# Patient Record
Sex: Male | Born: 1965 | Race: White | Hispanic: No | Marital: Single | Smoking: Current every day smoker
Health system: Southern US, Community
[De-identification: ages and names within clinical notes are randomized; demographics above are authoritative.]

## PROBLEM LIST (undated history)

## (undated) DIAGNOSIS — J449 Chronic obstructive pulmonary disease, unspecified: Secondary | ICD-10-CM

## (undated) DIAGNOSIS — G8929 Other chronic pain: Secondary | ICD-10-CM

## (undated) DIAGNOSIS — Z720 Tobacco use: Secondary | ICD-10-CM

## (undated) DIAGNOSIS — D649 Anemia, unspecified: Secondary | ICD-10-CM

## (undated) DIAGNOSIS — R079 Chest pain, unspecified: Secondary | ICD-10-CM

## (undated) DIAGNOSIS — IMO0001 Reserved for inherently not codable concepts without codable children: Secondary | ICD-10-CM

## (undated) DIAGNOSIS — K219 Gastro-esophageal reflux disease without esophagitis: Secondary | ICD-10-CM

## (undated) DIAGNOSIS — F1010 Alcohol abuse, uncomplicated: Secondary | ICD-10-CM

## (undated) DIAGNOSIS — N200 Calculus of kidney: Secondary | ICD-10-CM

## (undated) DIAGNOSIS — I10 Essential (primary) hypertension: Secondary | ICD-10-CM

## (undated) HISTORY — PX: OTHER SURGICAL HISTORY: SHX169

---

## 2002-04-04 ENCOUNTER — Ambulatory Visit (HOSPITAL_COMMUNITY): Admission: RE | Admit: 2002-04-04 | Discharge: 2002-04-04 | Payer: Self-pay | Admitting: *Deleted

## 2002-04-04 ENCOUNTER — Encounter (INDEPENDENT_AMBULATORY_CARE_PROVIDER_SITE_OTHER): Payer: Self-pay | Admitting: Specialist

## 2004-02-20 ENCOUNTER — Inpatient Hospital Stay (HOSPITAL_COMMUNITY): Admission: AC | Admit: 2004-02-20 | Discharge: 2004-02-23 | Payer: Self-pay

## 2004-04-09 ENCOUNTER — Encounter: Admission: RE | Admit: 2004-04-09 | Discharge: 2004-04-09 | Payer: Self-pay | Admitting: Family Medicine

## 2010-02-02 ENCOUNTER — Inpatient Hospital Stay (HOSPITAL_COMMUNITY)
Admission: AD | Admit: 2010-02-02 | Discharge: 2010-02-06 | Payer: Self-pay | Source: Home / Self Care | Admitting: Internal Medicine

## 2010-02-02 ENCOUNTER — Ambulatory Visit: Payer: Self-pay | Admitting: Diagnostic Radiology

## 2010-02-02 ENCOUNTER — Encounter: Payer: Self-pay | Admitting: Emergency Medicine

## 2010-03-30 ENCOUNTER — Encounter: Payer: Self-pay | Admitting: Family Medicine

## 2010-05-20 LAB — BASIC METABOLIC PANEL
Calcium: 8.6 mg/dL (ref 8.4–10.5)
GFR calc Af Amer: >60 mL/min (ref >60)
GFR calc non Af Amer: >60 mL/min (ref >60)
Sodium: 130 mEq/L — ABNORMAL LOW (ref 135–145)

## 2010-05-21 LAB — LIPASE, BLOOD: Lipase: 51 U/L (ref 23–300)

## 2010-05-21 LAB — TSH: TSH: 2.844 u[IU]/mL (ref 0.350–4.500)

## 2010-05-21 LAB — CULTURE, BLOOD (ROUTINE X 2)
Culture  Setup Time: 201111271932
Culture  Setup Time: 201111280942
Culture  Setup Time: 201111280942
Culture: NO GROWTH
Culture: NO GROWTH

## 2010-05-21 LAB — DIFFERENTIAL
Eosinophils Absolute: 0 10*3/uL (ref 0.0–0.7)
Eosinophils Relative: 0 % (ref 0–5)
Lymphs Abs: 0.9 10*3/uL (ref 0.7–4.0)
Monocytes Absolute: 0.7 10*3/uL (ref 0.1–1.0)

## 2010-05-21 LAB — CK TOTAL AND CKMB (NOT AT ARMC)
CK, MB: 1 ng/mL (ref 0.3–4.0)
Relative Index: INVALID (ref 0.0–2.5)
Relative Index: INVALID (ref 0.0–2.5)

## 2010-05-21 LAB — CBC
HCT: 45.5 % (ref 39.0–52.0)
HCT: 46 % (ref 39.0–52.0)
Hemoglobin: 16.1 g/dL (ref 13.0–17.0)
Hemoglobin: 17.8 g/dL — ABNORMAL HIGH (ref 13.0–17.0)
MCHC: 35 g/dL (ref 30.0–36.0)
MCV: 95 fL (ref 78.0–100.0)
MCV: 98.4 fL (ref 78.0–100.0)
Platelets: 193 10*3/uL (ref 150–400)
RBC: 4.62 MIL/uL (ref 4.22–5.81)
RBC: 5.2 MIL/uL (ref 4.22–5.81)
RDW: 12.6 % (ref 11.5–15.5)
WBC: 8.1 10*3/uL (ref 4.0–10.5)
WBC: 9.7 10*3/uL (ref 4.0–10.5)

## 2010-05-21 LAB — BASIC METABOLIC PANEL
BUN: 7 mg/dL (ref 6–23)
Chloride: 93 mEq/L — ABNORMAL LOW (ref 96–112)
Chloride: 97 mEq/L (ref 96–112)
GFR calc Af Amer: >60 mL/min (ref >60)
Glucose, Bld: 109 mg/dL — ABNORMAL HIGH (ref 70–99)
Potassium: 4.3 mEq/L (ref 3.5–5.1)
Potassium: 4.6 mEq/L (ref 3.5–5.1)
Sodium: 128 mEq/L — ABNORMAL LOW (ref 135–145)

## 2010-05-21 LAB — CULTURE, RESPIRATORY W GRAM STAIN

## 2010-05-21 LAB — LEGIONELLA ANTIGEN, URINE

## 2010-05-21 LAB — COMPREHENSIVE METABOLIC PANEL
ALT: 16 U/L (ref 0–53)
AST: 27 U/L (ref 0–37)
Albumin: 3.8 g/dL (ref 3.5–5.2)
CO2: 20 mEq/L (ref 19–32)
Calcium: 9.5 mg/dL (ref 8.4–10.5)
Chloride: 97 mEq/L (ref 96–112)
GFR calc Af Amer: >60 mL/min (ref >60)
GFR calc non Af Amer: >60 mL/min (ref >60)
Sodium: 132 mEq/L — ABNORMAL LOW (ref 135–145)

## 2010-05-21 LAB — TROPONIN I
Troponin I: 0.01 ng/mL (ref 0.00–0.06)
Troponin I: 0.01 ng/mL (ref 0.00–0.06)

## 2010-05-21 LAB — EXPECTORATED SPUTUM ASSESSMENT W GRAM STAIN, RFLX TO RESP C

## 2010-07-25 NOTE — Op Note (Signed)
   NAME:  Cody [name], Cody [name]                          ACCOUNT NO.:  [id number]   MEDICAL RECORD NO.:  [id number]                   PATIENT TYPE:  AMB   LOCATION:  ENDO                                 FACILITY:  [facility]   PHYSICIAN:  Georgiana Spinner, M.D.                 DATE OF BIRTH:  1965-05-18   DATE OF PROCEDURE:  DATE OF DISCHARGE:                                 OPERATIVE REPORT   PROCEDURE PERFORMED:  Endoscopy.   INDICATIONS FOR PROCEDURE:  Abdominal pain.   ANESTHESIA:  Demerol 60, Versed 6 mg and fentanyl 75 mcg.   DESCRIPTION OF PROCEDURE:  With the patient mildly sedated in the left  lateral decubitus position, the Olympus videoscopic endoscope was inserted  into the mouth and passed under direct vision through the esophagus which  appeared normal.  There was no evidence of Barrett's seen.  The endoscope  was advanced into the stomach, fundus body appeared normal.  The antrum  showed changes of erythema with punctate lesions which were photographed and  biopsied.  The duodenal bulb showed mild erythema.  The second portion of  the duodenum appeared normal.  From this point, the endoscope was slowly  withdrawn, taking circumferential views of the entire duodenal mucosa until  the endoscope had been pulled back into the stomach and placed in  retroflexion to view of the stomach from below.  The endoscope was then  straightened and withdrawn, taking circumferential views of the remaining  gastric and esophageal mucosa.  The patient's vital signs and pulse oximetry  remained stable.  The patient tolerated the procedure well and left the  operating room without any complications.   FINDINGS:  Erosions, ulcerations in the antrum, otherwise unremarkable  examination.  There was transient lower esophageal sphincter dysfunction.   IMPRESSION:  Antral erosions with transient lower esophageal sphincter  relaxation.   PLAN:  Await biopsy report.  The patient will call me for results and  follow  up with me as an outpatient.                                                Georgiana Spinner, M.D.    GMO/MEDQ  D:  04/04/2002  T:  04/04/2002  Job:  [id number]

## 2014-01-11 ENCOUNTER — Encounter (HOSPITAL_COMMUNITY): Payer: Self-pay | Admitting: Emergency Medicine

## 2014-01-11 ENCOUNTER — Emergency Department (HOSPITAL_COMMUNITY): Payer: BC Managed Care – PPO

## 2014-01-11 ENCOUNTER — Emergency Department (HOSPITAL_COMMUNITY)
Admission: EM | Admit: 2014-01-11 | Discharge: 2014-01-11 | Disposition: A | Discharge status: Home / Self Care | Payer: BC Managed Care – PPO | Attending: Emergency Medicine | Admitting: Emergency Medicine

## 2014-01-11 DIAGNOSIS — Z720 Tobacco use: Secondary | ICD-10-CM | POA: Insufficient documentation

## 2014-01-11 DIAGNOSIS — R079 Chest pain, unspecified: Secondary | ICD-10-CM | POA: Diagnosis present

## 2014-01-11 DIAGNOSIS — Z8739 Personal history of other diseases of the musculoskeletal system and connective tissue: Secondary | ICD-10-CM | POA: Diagnosis not present

## 2014-01-11 DIAGNOSIS — H9209 Otalgia, unspecified ear: Secondary | ICD-10-CM | POA: Insufficient documentation

## 2014-01-11 DIAGNOSIS — G8929 Other chronic pain: Secondary | ICD-10-CM | POA: Insufficient documentation

## 2014-01-11 DIAGNOSIS — J441 Chronic obstructive pulmonary disease with (acute) exacerbation: Secondary | ICD-10-CM | POA: Diagnosis not present

## 2014-01-11 DIAGNOSIS — Z87828 Personal history of other (healed) physical injury and trauma: Secondary | ICD-10-CM | POA: Insufficient documentation

## 2014-01-11 DIAGNOSIS — H539 Unspecified visual disturbance: Secondary | ICD-10-CM | POA: Insufficient documentation

## 2014-01-11 DIAGNOSIS — R0789 Other chest pain: Secondary | ICD-10-CM | POA: Diagnosis not present

## 2014-01-11 DIAGNOSIS — Z8659 Personal history of other mental and behavioral disorders: Secondary | ICD-10-CM | POA: Insufficient documentation

## 2014-01-11 HISTORY — DX: Other chronic pain: G89.29

## 2014-01-11 HISTORY — DX: Chest pain, unspecified: R07.9

## 2014-01-11 HISTORY — DX: Chronic obstructive pulmonary disease, unspecified: J44.9

## 2014-01-11 LAB — CBC WITH DIFFERENTIAL/PLATELET
BASOS ABS: 0 10*3/uL (ref 0.0–0.1)
BASOS PCT: 1 % (ref 0–1)
EOS ABS: 0.1 10*3/uL (ref 0.0–0.7)
EOS PCT: 2 % (ref 0–5)
HCT: 50.8 % (ref 39.0–52.0)
Hemoglobin: 17.7 g/dL — ABNORMAL HIGH (ref 13.0–17.0)
LYMPHS PCT: 38 % (ref 12–46)
Lymphs Abs: 2 10*3/uL (ref 0.7–4.0)
MCH: 37.3 pg — ABNORMAL HIGH (ref 26.0–34.0)
MCHC: 34.8 g/dL (ref 30.0–36.0)
MCV: 106.9 fL — AB (ref 78.0–100.0)
Monocytes Absolute: 0.5 10*3/uL (ref 0.1–1.0)
Monocytes Relative: 9 % (ref 3–12)
Neutro Abs: 2.6 10*3/uL (ref 1.7–7.7)
Neutrophils Relative %: 50 % (ref 43–77)
PLATELETS: 249 10*3/uL (ref 150–400)
RBC: 4.75 MIL/uL (ref 4.22–5.81)
RDW: 16.5 % — AB (ref 11.5–15.5)
WBC: 5.2 10*3/uL (ref 4.0–10.5)

## 2014-01-11 LAB — I-STAT TROPONIN, ED
Troponin i, poc: 0 ng/mL (ref 0.00–0.08)
Troponin i, poc: 0 ng/mL (ref 0.00–0.08)

## 2014-01-11 LAB — COMPREHENSIVE METABOLIC PANEL
ALK PHOS: 95 U/L (ref 39–117)
ALT: 18 U/L (ref 0–53)
AST: 23 U/L (ref 0–37)
Albumin: 3.2 g/dL — ABNORMAL LOW (ref 3.5–5.2)
Anion gap: 9 (ref 5–15)
BILIRUBIN TOTAL: 0.4 mg/dL (ref 0.3–1.2)
BUN: 7 mg/dL (ref 6–23)
CO2: 29 meq/L (ref 19–32)
CREATININE: 0.85 mg/dL (ref 0.50–1.35)
Calcium: 9.5 mg/dL (ref 8.4–10.5)
Chloride: 105 mEq/L (ref 96–112)
GFR calc Af Amer: >90 mL/min (ref >90)
Glucose, Bld: 115 mg/dL — ABNORMAL HIGH (ref 70–99)
Potassium: 4.2 mEq/L (ref 3.7–5.3)
Sodium: 143 mEq/L (ref 137–147)
Total Protein: 6.9 g/dL (ref 6.0–8.3)

## 2014-01-11 LAB — LIPASE, BLOOD: LIPASE: 21 U/L (ref 11–59)

## 2014-01-11 MED ORDER — ASPIRIN 81 MG PO CHEW
324.0000 mg | CHEWABLE_TABLET | Freq: Once | ORAL | Status: AC
  Administered 2014-01-11: 324 mg via ORAL
  Filled 2014-01-11: qty 4

## 2014-01-11 MED ORDER — PANTOPRAZOLE SODIUM 20 MG PO TBEC: 20.0000 mg | DELAYED_RELEASE_TABLET | Freq: Every day | ORAL | Status: DC

## 2014-01-11 MED ORDER — GI COCKTAIL ~~LOC~~
30.0000 mL | Freq: Once | ORAL | Status: AC
  Administered 2014-01-11: 30 mL via ORAL
  Filled 2014-01-11: qty 30

## 2014-01-11 NOTE — Discharge Instructions (Signed)
Chest Pain (Nonspecific) °It is often hard to give a specific diagnosis for the cause of chest pain. There is always a chance that your pain could be related to something serious, such as a heart attack or a blood clot in the lungs. You need to follow up with your health care provider for further evaluation. °CAUSES  °· Heartburn. °· Pneumonia or bronchitis. °· Anxiety or stress. °· Inflammation around your heart (pericarditis) or lung (pleuritis or pleurisy). °· A blood clot in the lung. °· A collapsed lung (pneumothorax). It can develop suddenly on its own (spontaneous pneumothorax) or from trauma to the chest. °· Shingles infection (herpes zoster virus). °The chest wall is composed of bones, muscles, and cartilage. Any of these can be the source of the pain. °· The bones can be bruised by injury. °· The muscles or cartilage can be strained by coughing or overwork. °· The cartilage can be affected by inflammation and become sore (costochondritis). °DIAGNOSIS  °Lab tests or other studies may be needed to find the cause of your pain. Your health care provider may have you take a test called an ambulatory electrocardiogram (ECG). An ECG records your heartbeat patterns over a 24-hour period. You may also have other tests, such as: °· Transthoracic echocardiogram (TTE). During echocardiography, sound waves are used to evaluate how blood flows through your heart. °· Transesophageal echocardiogram (TEE). °· Cardiac monitoring. This allows your health care provider to monitor your heart rate and rhythm in real time. °· Holter monitor. This is a portable device that records your heartbeat and can help diagnose heart arrhythmias. It allows your health care provider to track your heart activity for several days, if needed. °· Stress tests by exercise or by giving medicine that makes the heart beat faster. °TREATMENT  °· Treatment depends on what may be causing your chest pain. Treatment may include: °¨ Acid blockers for  heartburn. °¨ Anti-inflammatory medicine. °¨ Pain medicine for inflammatory conditions. °¨ Antibiotics if an infection is present. °· You may be advised to change lifestyle habits. This includes stopping smoking and avoiding alcohol, caffeine, and chocolate. °· You may be advised to keep your head raised (elevated) when sleeping. This reduces the chance of acid going backward from your stomach into your esophagus. °Most of the time, nonspecific chest pain will improve within 2-3 days with rest and mild pain medicine.  °HOME CARE INSTRUCTIONS  °· If antibiotics were prescribed, take them as directed. Finish them even if you start to feel better. °· For the next few days, avoid physical activities that bring on chest pain. Continue physical activities as directed. °· Do not use any tobacco products, including cigarettes, chewing tobacco, or electronic cigarettes. °· Avoid drinking alcohol. °· Only take medicine as directed by your health care provider. °· Follow your health care provider's suggestions for further testing if your chest pain does not go away. °· Keep any follow-up appointments you made. If you do not go to an appointment, you could develop lasting (chronic) problems with pain. If there is any problem keeping an appointment, call to reschedule. °SEEK MEDICAL CARE IF:  °· Your chest pain does not go away, even after treatment. °· You have a rash with blisters on your chest. °· You have a fever. °SEEK IMMEDIATE MEDICAL CARE IF:  °· You have increased chest pain or pain that spreads to your arm, neck, jaw, back, or abdomen. °· You have shortness of breath. °· You have an increasing cough, or you cough   up blood.  You have severe back or abdominal pain.  You feel nauseous or vomit.  You have severe weakness.  You faint.  You have chills. This is an emergency. Do not wait to see if the pain will go away. Get medical help at once. Call your local emergency services (911 in [place] Do not drive  yourself to the hospital. MAKE SURE YOU:   Understand these instructions.  Will watch your condition.  Will get help right away if you are not doing well or get worse. Document Released: 12/03/2004 Document Revised: 02/28/2013 Document Reviewed: 09/29/2007 [facility] Patient Information 2015 [place], [place]. This information is not intended to replace advice given to you by your health care provider. Make sure you discuss any questions you have with your health care provider. Alcohol Use Disorder Alcohol use disorder is a mental disorder. It is not a one-time incident of heavy drinking. Alcohol use disorder is the excessive and uncontrollable use of alcohol over time that leads to problems with functioning in one or more areas of daily living. People with this disorder risk harming themselves and others when they drink to excess. Alcohol use disorder also can cause other mental disorders, such as mood and anxiety disorders, and serious physical problems. People with alcohol use disorder often misuse other drugs.  Alcohol use disorder is common and widespread. Some people with this disorder drink alcohol to cope with or escape from negative life events. Others drink to relieve chronic pain or symptoms of mental illness. People with a family history of alcohol use disorder are at higher risk of losing control and using alcohol to excess.  SYMPTOMS  Signs and symptoms of alcohol use disorder may include the following:   Consumption ofalcohol inlarger amounts or over a longer period of time than intended.  Multiple unsuccessful attempts to cutdown or control alcohol use.   A great deal of time spent obtaining alcohol, using alcohol, or recovering from the effects of alcohol (hangover).  A strong desire or urge to use alcohol (cravings).   Continued use of alcohol despite problems at work, school, or home because of alcohol use.   Continued use of alcohol despite problems in relationships  because of alcohol use.  Continued use of alcohol in situations when it is physically hazardous, such as driving a car.  Continued use of alcohol despite awareness of a physical or psychological problem that is likely related to alcohol use. Physical problems related to alcohol use can involve the brain, heart, liver, stomach, and intestines. Psychological problems related to alcohol use include intoxication, depression, anxiety, psychosis, delirium, and dementia.   The need for increased amounts of alcohol to achieve the same desired effect, or a decreased effect from the consumption of the same amount of alcohol (tolerance).  Withdrawal symptoms upon reducing or stopping alcohol use, or alcohol use to reduce or avoid withdrawal symptoms. Withdrawal symptoms include:  Racing heart.  Hand tremor.  Difficulty sleeping.  Nausea.  Vomiting.  Hallucinations.  Restlessness.  Seizures. DIAGNOSIS Alcohol use disorder is diagnosed through an assessment by your health care provider. Your health care provider may start by asking three or four questions to screen for excessive or problematic alcohol use. To confirm a diagnosis of alcohol use disorder, at least two symptoms must be present within a 45-month period. The severity of alcohol use disorder depends on the number of symptoms:  Mild--two or three.  Moderate--four or five.  Severe--six or more. Your health care provider may perform a  physical exam or use results from lab tests to see if you have physical problems resulting from alcohol use. Your health care provider may refer you to a mental health professional for evaluation. TREATMENT  Some people with alcohol use disorder are able to reduce their alcohol use to low-risk levels. Some people with alcohol use disorder need to quit drinking alcohol. When necessary, mental health professionals with specialized training in substance use treatment can help. Your health care provider can  help you decide how severe your alcohol use disorder is and what type of treatment you need. The following forms of treatment are available:   Detoxification. Detoxification involves the use of prescription medicines to prevent alcohol withdrawal symptoms in the first week after quitting. This is important for people with a history of symptoms of withdrawal and for heavy drinkers who are likely to have withdrawal symptoms. Alcohol withdrawal can be dangerous and, in severe cases, cause death. Detoxification is usually provided in a hospital or in-patient substance use treatment facility.  Counseling or talk therapy. Talk therapy is provided by substance use treatment counselors. It addresses the reasons people use alcohol and ways to keep them from drinking again. The goals of talk therapy are to help people with alcohol use disorder find healthy activities and ways to cope with life stress, to identify and avoid triggers for alcohol use, and to handle cravings, which can cause relapse.  Medicines.Different medicines can help treat alcohol use disorder through the following actions:  Decrease alcohol cravings.  Decrease the positive reward response felt from alcohol use.  Produce an uncomfortable physical reaction when alcohol is used (aversion therapy).  Support groups. Support groups are run by people who have quit drinking. They provide emotional support, advice, and guidance. These forms of treatment are often combined. Some people with alcohol use disorder benefit from intensive combination treatment provided by specialized substance use treatment centers. Both inpatient and outpatient treatment programs are available. Document Released: 04/02/2004 Document Revised: 07/10/2013 Document Reviewed: 06/02/2012 [facility] Patient Information 2015 [place], [place]. This information is not intended to replace advice given to you by your health care provider. Make sure you discuss any questions you have  with your health care provider.
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
## 2014-01-11 NOTE — ED Notes (Signed)
Patient c/o mid-sternal chest pain x2 weeks that has progressively gotten worse in past 2 days. Patient reports shortness of breath but denies any weakness, dizziness, or back pain. Reports taking Advil yesterday with no relief.

## 2014-08-16 ENCOUNTER — Observation Stay (HOSPITAL_COMMUNITY)
Admission: EM | Admit: 2014-08-16 | Discharge: 2014-08-17 | Disposition: A | Discharge status: Home / Self Care | Payer: 59 | Attending: Family Medicine | Admitting: Family Medicine

## 2014-08-16 ENCOUNTER — Emergency Department (HOSPITAL_COMMUNITY): Payer: 59

## 2014-08-16 ENCOUNTER — Encounter (HOSPITAL_COMMUNITY): Payer: Self-pay

## 2014-08-16 DIAGNOSIS — J449 Chronic obstructive pulmonary disease, unspecified: Secondary | ICD-10-CM | POA: Diagnosis not present

## 2014-08-16 DIAGNOSIS — M1990 Unspecified osteoarthritis, unspecified site: Secondary | ICD-10-CM | POA: Insufficient documentation

## 2014-08-16 DIAGNOSIS — F1010 Alcohol abuse, uncomplicated: Secondary | ICD-10-CM

## 2014-08-16 DIAGNOSIS — I10 Essential (primary) hypertension: Secondary | ICD-10-CM

## 2014-08-16 DIAGNOSIS — F329 Major depressive disorder, single episode, unspecified: Secondary | ICD-10-CM | POA: Insufficient documentation

## 2014-08-16 DIAGNOSIS — R1013 Epigastric pain: Secondary | ICD-10-CM | POA: Insufficient documentation

## 2014-08-16 DIAGNOSIS — G8929 Other chronic pain: Secondary | ICD-10-CM | POA: Insufficient documentation

## 2014-08-16 DIAGNOSIS — K921 Melena: Secondary | ICD-10-CM | POA: Insufficient documentation

## 2014-08-16 DIAGNOSIS — R079 Chest pain, unspecified: Secondary | ICD-10-CM | POA: Diagnosis present

## 2014-08-16 DIAGNOSIS — Z720 Tobacco use: Secondary | ICD-10-CM | POA: Diagnosis not present

## 2014-08-16 DIAGNOSIS — R55 Syncope and collapse: Secondary | ICD-10-CM | POA: Diagnosis not present

## 2014-08-16 HISTORY — DX: Tobacco use: Z72.0

## 2014-08-16 HISTORY — DX: Essential (primary) hypertension: I10

## 2014-08-16 HISTORY — DX: Alcohol abuse, uncomplicated: F10.10

## 2014-08-16 LAB — URINALYSIS, ROUTINE W REFLEX MICROSCOPIC
GLUCOSE, UA: 100 mg/dL — AB
Hgb urine dipstick: NEGATIVE
KETONES UR: NEGATIVE mg/dL
Leukocytes, UA: NEGATIVE
NITRITE: NEGATIVE
PH: 7 (ref 5.0–8.0)
Protein, ur: NEGATIVE mg/dL
Specific Gravity, Urine: 1.01 (ref 1.005–1.030)
Urobilinogen, UA: 2 mg/dL — ABNORMAL HIGH (ref 0.0–1.0)

## 2014-08-16 LAB — CBC
HCT: 43.3 % (ref 39.0–52.0)
HEMOGLOBIN: 16.4 g/dL (ref 13.0–17.0)
MCH: 43 pg — ABNORMAL HIGH (ref 26.0–34.0)
MCHC: 37.9 g/dL — ABNORMAL HIGH (ref 30.0–36.0)
MCV: 113.6 fL — AB (ref 78.0–100.0)
Platelets: 304 10*3/uL (ref 150–400)
RBC: 3.81 MIL/uL — ABNORMAL LOW (ref 4.22–5.81)
RDW: 12.9 % (ref 11.5–15.5)
WBC: 6.3 10*3/uL (ref 4.0–10.5)

## 2014-08-16 LAB — HEPATIC FUNCTION PANEL
ALBUMIN: 3.4 g/dL — AB (ref 3.5–5.0)
ALT: 100 U/L — AB (ref 17–63)
AST: 93 U/L — ABNORMAL HIGH (ref 15–41)
Alkaline Phosphatase: 56 U/L (ref 38–126)
BILIRUBIN INDIRECT: 0.9 mg/dL (ref 0.3–0.9)
BILIRUBIN TOTAL: 1.2 mg/dL (ref 0.3–1.2)
Bilirubin, Direct: 0.3 mg/dL (ref 0.1–0.5)
Total Protein: 6.9 g/dL (ref 6.5–8.1)

## 2014-08-16 LAB — BASIC METABOLIC PANEL
ANION GAP: 11 (ref 5–15)
BUN: 18 mg/dL (ref 6–20)
CALCIUM: 9.4 mg/dL (ref 8.9–10.3)
CO2: 29 mmol/L (ref 22–32)
Chloride: 95 mmol/L — ABNORMAL LOW (ref 101–111)
Creatinine, Ser: 1.13 mg/dL (ref 0.61–1.24)
GFR calc Af Amer: >60 mL/min (ref >60)
GFR calc non Af Amer: >60 mL/min (ref >60)
Glucose, Bld: 147 mg/dL — ABNORMAL HIGH (ref 65–99)
Potassium: 3.4 mmol/L — ABNORMAL LOW (ref 3.5–5.1)
SODIUM: 135 mmol/L (ref 135–145)

## 2014-08-16 LAB — VITAMIN B12: Vitamin B-12: 443 pg/mL (ref 180–914)

## 2014-08-16 LAB — TROPONIN I
Troponin I: <0.03 ng/mL (ref <0.031)
Troponin I: <0.03 ng/mL (ref <0.031)

## 2014-08-16 LAB — LIPASE, BLOOD: Lipase: 53 U/L — ABNORMAL HIGH (ref 22–51)

## 2014-08-16 LAB — TSH: TSH: 2.144 u[IU]/mL (ref 0.350–4.500)

## 2014-08-16 LAB — POC OCCULT BLOOD, ED: Fecal Occult Bld: NEGATIVE

## 2014-08-16 MED ORDER — GI COCKTAIL ~~LOC~~
30.0000 mL | Freq: Once | ORAL | Status: AC
  Administered 2014-08-16: 30 mL via ORAL
  Filled 2014-08-16: qty 30

## 2014-08-16 MED ORDER — ONDANSETRON HCL 4 MG PO TABS: 4.0000 mg | ORAL_TABLET | Freq: Four times a day (QID) | ORAL | Status: DC | PRN

## 2014-08-16 MED ORDER — PANTOPRAZOLE SODIUM 40 MG IV SOLR
40.0000 mg | Freq: Two times a day (BID) | INTRAVENOUS | Status: DC
  Administered 2014-08-16: 40 mg via INTRAVENOUS
  Filled 2014-08-16: qty 40

## 2014-08-16 MED ORDER — PANTOPRAZOLE SODIUM 40 MG IV SOLR: 40.0000 mg | Freq: Once | INTRAVENOUS | Status: DC

## 2014-08-16 MED ORDER — PANTOPRAZOLE SODIUM 40 MG IV SOLR
40.0000 mg | Freq: Once | INTRAVENOUS | Status: AC
  Administered 2014-08-16: 40 mg via INTRAVENOUS
  Filled 2014-08-16: qty 40

## 2014-08-16 MED ORDER — SODIUM CHLORIDE 0.9 % IV SOLN
INTRAVENOUS | Status: DC
  Administered 2014-08-16: 18:00:00 via INTRAVENOUS

## 2014-08-16 MED ORDER — SODIUM CHLORIDE 0.9 % IV BOLUS (SEPSIS)
1000.0000 mL | Freq: Once | INTRAVENOUS | Status: AC
  Administered 2014-08-16: 1000 mL via INTRAVENOUS

## 2014-08-16 MED ORDER — ASPIRIN 325 MG PO TABS
325.0000 mg | ORAL_TABLET | ORAL | Status: AC
  Administered 2014-08-16: 325 mg via ORAL
  Filled 2014-08-16: qty 1

## 2014-08-16 MED ORDER — AMLODIPINE BESYLATE 5 MG PO TABS
5.0000 mg | ORAL_TABLET | Freq: Every day | ORAL | Status: DC
  Administered 2014-08-16 – 2014-08-17 (×2): 5 mg via ORAL
  Filled 2014-08-16 (×2): qty 1

## 2014-08-16 MED ORDER — ONDANSETRON HCL 4 MG/2ML IJ SOLN: 4.0000 mg | Freq: Four times a day (QID) | INTRAMUSCULAR | Status: DC | PRN

## 2014-08-16 MED ORDER — PANTOPRAZOLE SODIUM 40 MG IV SOLR
40.0000 mg | Freq: Two times a day (BID) | INTRAVENOUS | Status: DC
Start: 2014-08-17 — End: 2014-08-17
  Administered 2014-08-17: 40 mg via INTRAVENOUS
  Filled 2014-08-16: qty 40

## 2014-08-16 MED ORDER — NICOTINE 21 MG/24HR TD PT24
21.0000 mg | MEDICATED_PATCH | Freq: Every day | TRANSDERMAL | Status: DC
  Administered 2014-08-16 – 2014-08-17 (×2): 21 mg via TRANSDERMAL
  Filled 2014-08-16 (×2): qty 1

## 2014-08-16 MED ORDER — FENTANYL CITRATE (PF) 100 MCG/2ML IJ SOLN
50.0000 ug | Freq: Once | INTRAMUSCULAR | Status: AC
  Administered 2014-08-16: 50 ug via INTRAVENOUS
  Filled 2014-08-16: qty 2

## 2014-08-16 MED ORDER — METOCLOPRAMIDE HCL 5 MG/ML IJ SOLN
10.0000 mg | Freq: Once | INTRAMUSCULAR | Status: AC
  Administered 2014-08-16: 10 mg via INTRAVENOUS
  Filled 2014-08-16: qty 2

## 2014-08-16 MED ORDER — TRAMADOL HCL 50 MG PO TABS
50.0000 mg | ORAL_TABLET | Freq: Once | ORAL | Status: AC
  Administered 2014-08-16: 50 mg via ORAL
  Filled 2014-08-16: qty 1

## 2014-08-16 MED ORDER — SODIUM CHLORIDE 0.9 % IJ SOLN: 3.0000 mL | Freq: Two times a day (BID) | INTRAMUSCULAR | Status: DC

## 2014-08-16 MED ORDER — ONDANSETRON HCL 4 MG/2ML IJ SOLN: 4.0000 mg | Freq: Once | INTRAMUSCULAR | Status: DC

## 2014-08-16 NOTE — ED Notes (Signed)
Pt also co difficulty swallowing x 1 week.

## 2014-08-16 NOTE — ED Notes (Signed)
Patient reports recurrent chest pain . Getting worse over the last week. Burning in the center of chest. BP fluctuating and dizziness

## 2014-08-16 NOTE — H&P (Signed)
[facility] History and Physical  ERSKINE STEINFELDT IEP:[id number] DOB: 1965-07-09 DOA: 08/16/2014  Referring physician: ER, Dr. Lacinda Axon PCP: Edmonia James, PA-C   Chief Complaint: Syncope. Chest pain.  HPI: Cody [name] is a 49 y.o. male  This is a 49 year old man who presented with 2 episodes of syncope in the last week. He had one yesterday. He describes a violent cough and after this he lost consciousness. He also describes chest pain which is burning in nature which is been present for several days starting from the epigastrium radiating aching up to his upper chest. It does not radiate into the arms. He admits that he drinks too much alcohol but he is trying to cut down. He also smokes cigarettes. He denies any nausea, dyspnea or fever. Evaluation in the emergency room does not show a cause of his syncope or chest pain but he is now being admitted for further investigations.   Review of Systems:  Apart from symptoms above, all systems negative.  Past Medical History  Diagnosis Date  . Arthritis   . COPD (chronic obstructive pulmonary disease)   . Depression   . Chronic chest pain   . Hypertension   . Alcohol abuse 08/16/2014  . Tobacco abuse 08/16/2014   History reviewed. No pertinent past surgical history. Social History:  reports that he has been smoking Cigarettes.  He has a 70 pack-year smoking history. He has never used smokeless tobacco. He reports that he drinks alcohol. He reports that he does not use illicit drugs.  No Known Allergies  Family History  Problem Relation Age of Onset  . Cancer Other   . Diabetes Other       Prior to Admission medications   Medication Sig Start Date End Date Taking? Authorizing Provider  amLODipine (NORVASC) 10 MG tablet Take 10 mg by mouth. 05/23/14 05/23/15 Yes Historical Provider, MD  ibuprofen (ADVIL,MOTRIN) 200 MG tablet Take 400 mg by mouth every 6 (six) hours as needed for moderate pain.   Yes Historical Provider, MD    lisinopril-hydrochlorothiazide (PRINZIDE,ZESTORETIC) 10-12.5 MG per tablet Take 1 tablet by mouth. 01/12/14 01/12/15 Yes Historical Provider, MD  pantoprazole (PROTONIX) 20 MG tablet Take 1 tablet (20 mg total) by mouth daily. Patient not taking: Reported on 08/16/2014 01/11/14   Pattricia Boss, MD   Physical Exam: Filed Vitals:   08/16/14 1515 08/16/14 1530 08/16/14 1545 08/16/14 1600  BP:  105/53  125/92  Pulse: 77 85 89 72  Temp:      TempSrc:      Resp: 17 15 17 15   Height:      Weight:      SpO2: 96% 96% 97% 97%    Wt Readings from Last 3 Encounters:  08/16/14 104.327 kg (230 lb)  01/11/14 104.327 kg (230 lb)    General:  Appears calm and comfortable. He does not appear to be in pain at the present time. Obese. Eyes: PERRL, normal lids, irises & conjunctiva ENT: grossly normal hearing, lips & tongue Neck: no LAD, masses or thyromegaly Cardiovascular: RRR, no m/r/g. No LE edema. Telemetry: SR, no arrhythmias  Respiratory: Bilateral scattered wheezing but no respiratory distress per there are no crackles or bronchial breathing. Abdomen: soft, ntnd Skin: no rash or induration seen on limited exam Musculoskeletal: grossly normal tone BUE/BLE Psychiatric: grossly normal mood and affect, speech fluent and appropriate Neurologic: grossly non-focal.          Labs on Admission:  Basic Metabolic Panel:  Recent Labs  Lab 08/16/14 1213  NA 135  K 3.4*  CL 95*  CO2 29  GLUCOSE 147*  BUN 18  CREATININE 1.13  CALCIUM 9.4   Liver Function Tests:  Recent Labs Lab 08/16/14 1213  AST 93*  ALT 100*  ALKPHOS 56  BILITOT 1.2  PROT 6.9  ALBUMIN 3.4*    Recent Labs Lab 08/16/14 1213  LIPASE 53*   No results for input(s): AMMONIA in the last 168 hours. CBC:  Recent Labs Lab 08/16/14 1213  WBC 6.3  HGB 16.4  HCT 43.3  MCV 113.6*  PLT 304   Cardiac Enzymes:  Recent Labs Lab 08/16/14 1213  TROPONINI <0.03    BNP (last 3 results) No results for input(s):  BNP in the last 8760 hours.  ProBNP (last 3 results) No results for input(s): PROBNP in the last 8760 hours.  CBG: No results for input(s): GLUCAP in the last 168 hours.  Radiological Exams on Admission: Dg Chest Port 1 View  08/16/2014   CLINICAL DATA:  Centralized chest pain for 1 week. Difficulty swallowing.  EXAM: PORTABLE CHEST - 1 VIEW  COMPARISON:  01/11/2014  FINDINGS: Stable cardiomegaly without edema, definite pneumonia, collapse or consolidation. No effusion pneumothorax. Azygos fissure in the right upper lobe noted, normal variant. No acute osseous finding. Healed upper left rib fractures.  IMPRESSION: Cardiomegaly without acute process.   Electronically Signed   By: Jerilynn Mages.  Shick M.D.   On: 08/16/2014 12:48    EKG: Independently reviewed. Sinus rhythm without any acute ST-T wave changes.  Assessment/Plan   1. Syncope. Etiologies not clear. One possibility is a vasovagal event from his description. We will cycle cardiac enzymes, obtain echocardiogram. 2. Chest pain. I think this likely represents severe heartburn and he could have esophagitis. I will ask gastroenterology to see this patient in consultation with a view to upper GI endoscopy, especially in view of his heavy alcohol consumption. Start intravenous Protonix twice a day. 3. Hypertension. Stable. Blood pressure actually has been on the soft side and we will discontinue one of his blood pressure medications and reduce amlodipine to 5 mg daily. 4. Alcohol abuse. We discussed how to reduce and eventually stop this. 5. Tobacco abuse. We also discussed smoking cessation. 6. Macrocytosis. Check B-12 and folate levels although I think it is likely secondary to alcohol itself. 7. Elevated liver enzymes. This is likely from alcohol abuse.  Further recommendations will depend on patient's hospital progress.  Code Status: Full code.  DVT Prophylaxis: SCDs.  Family Communication: I discussed the plan with the patient at the  bedside.   Disposition Plan: Home when medically stable.  Time spent: 60 minutes.  Doree Albee [facility] Pager [phone].

## 2014-08-16 NOTE — ED Provider Notes (Signed)
CSN: [id number]     Arrival date & time 08/16/14  1200 History   First MD Initiated Contact with Patient 08/16/14 1225     Chief Complaint  Patient presents with  . Chest Pain     (Consider location/radiation/quality/duration/timing/severity/associated sxs/prior Treatment) HPI.... Syncopal episodes 2 in the past week. Blood pressure has been noted to be low. Patient stopped his blood pressure medications recently. He has a history of alcohol and tobacco abuse.  Additionally, patient complains of a burning sensation in his epigastrium radiating to his upper chest. Negative stress tests approximate 5 years ago. No dyspnea, diaphoresis, nausea.  Past Medical History  Diagnosis Date  . Arthritis   . COPD (chronic obstructive pulmonary disease)   . Depression   . Chronic chest pain   . Hypertension    History reviewed. No pertinent past surgical history. Family History  Problem Relation Age of Onset  . Cancer Other   . Diabetes Other    History  Substance Use Topics  . Smoking status: Current Every Day Smoker -- 2.00 packs/day for 35 years    Types: Cigarettes  . Smokeless tobacco: Never Used  . Alcohol Use: Yes     Comment: 1 pint of liquor a night    Review of Systems  All other systems reviewed and are negative.     Allergies  Review of patient's allergies indicates no known allergies.  Home Medications   Prior to Admission medications   Medication Sig Start Date End Date Taking? Authorizing Provider  amLODipine (NORVASC) 10 MG tablet Take 10 mg by mouth. 05/23/14 05/23/15 Yes Historical Provider, MD  ibuprofen (ADVIL,MOTRIN) 200 MG tablet Take 400 mg by mouth every 6 (six) hours as needed for moderate pain.   Yes Historical Provider, MD  lisinopril-hydrochlorothiazide (PRINZIDE,ZESTORETIC) 10-12.5 MG per tablet Take 1 tablet by mouth. 01/12/14 01/12/15 Yes Historical Provider, MD  pantoprazole (PROTONIX) 20 MG tablet Take 1 tablet (20 mg total) by mouth daily. Patient  not taking: Reported on 08/16/2014 01/11/14   Pattricia Boss, MD   BP 125/92 mmHg  Pulse 72  Temp(Src) 98.6 F (37 C) (Oral)  Resp 15  Ht 5\' 10"  (1.778 m)  Wt 230 lb (104.327 kg)  BMI 33.00 kg/m2  SpO2 97% Physical Exam  Constitutional: He is oriented to person, place, and time. He appears well-developed and well-nourished.  HENT:  Head: Normocephalic and atraumatic.  Eyes: Conjunctivae and EOM are normal. Pupils are equal, round, and reactive to light.  Neck: Normal range of motion. Neck supple.  Cardiovascular: Normal rate and regular rhythm.   Pulmonary/Chest: Effort normal and breath sounds normal.  Abdominal: Soft. Bowel sounds are normal.  Musculoskeletal: Normal range of motion.  Neurological: He is alert and oriented to person, place, and time.  Skin: Skin is warm and dry.  Psychiatric: He has a normal mood and affect. His behavior is normal.  Nursing note and vitals reviewed.   ED Course  Procedures (including critical care time) Labs Review Labs Reviewed  CBC - Abnormal; Notable for the following:    RBC 3.81 (*)    MCV 113.6 (*)    MCH 43.0 (*)    MCHC 37.9 (*)    All other components within normal limits  BASIC METABOLIC PANEL - Abnormal; Notable for the following:    Potassium 3.4 (*)    Chloride 95 (*)    Glucose, Bld 147 (*)    All other components within normal limits  TROPONIN I  HEPATIC FUNCTION  PANEL  LIPASE, BLOOD  URINALYSIS, ROUTINE W REFLEX MICROSCOPIC (NOT AT [facility])  POC OCCULT BLOOD, ED    Imaging Review Dg Chest Port 1 View  08/16/2014   CLINICAL DATA:  Centralized chest pain for 1 week. Difficulty swallowing.  EXAM: PORTABLE CHEST - 1 VIEW  COMPARISON:  01/11/2014  FINDINGS: Stable cardiomegaly without edema, definite pneumonia, collapse or consolidation. No effusion pneumothorax. Azygos fissure in the right upper lobe noted, normal variant. No acute osseous finding. Healed upper left rib fractures.  IMPRESSION: Cardiomegaly without acute  process.   Electronically Signed   By: Jerilynn Mages.  Shick M.D.   On: 08/16/2014 12:48     EKG Interpretation   Date/Time:  Thursday August 16 2014 15:55:30 EDT Ventricular Rate:  84 PR Interval:  171 QRS Duration: 87 QT Interval:  362 QTC Calculation: 428 R Axis:   68 Text Interpretation:  Sinus rhythm Multiple ventricular premature  complexes Low voltage, extremity and precordial leads left posterior  fascicular block resolved No significant change since last tracing  Confirmed by KNAPP  MD-J, JON ([id number]) on 08/16/2014 3:58:03 PM      MDM   Final diagnoses:  Syncope, unspecified syncope type    Patient complains of syncope and burning in his lower chest.  EKG shows no acute changes. Troponin negative. Chest x-ray negative. Admit to workup syncope, hypotension, alcohol abuse.   Nat Christen, MD 08/16/14 [id number]

## 2014-08-16 NOTE — ED Notes (Signed)
Pt co return of epigastric pain.

## 2014-08-17 ENCOUNTER — Telehealth: Payer: Self-pay | Admitting: Nurse Practitioner

## 2014-08-17 ENCOUNTER — Inpatient Hospital Stay (HOSPITAL_COMMUNITY): Payer: 59

## 2014-08-17 DIAGNOSIS — F1010 Alcohol abuse, uncomplicated: Secondary | ICD-10-CM | POA: Diagnosis not present

## 2014-08-17 DIAGNOSIS — K921 Melena: Secondary | ICD-10-CM

## 2014-08-17 DIAGNOSIS — R1013 Epigastric pain: Secondary | ICD-10-CM | POA: Diagnosis not present

## 2014-08-17 DIAGNOSIS — R079 Chest pain, unspecified: Secondary | ICD-10-CM

## 2014-08-17 DIAGNOSIS — Z720 Tobacco use: Secondary | ICD-10-CM | POA: Diagnosis not present

## 2014-08-17 LAB — COMPREHENSIVE METABOLIC PANEL
ALBUMIN: 2.7 g/dL — AB (ref 3.5–5.0)
ALT: 69 U/L — ABNORMAL HIGH (ref 17–63)
AST: 49 U/L — AB (ref 15–41)
Alkaline Phosphatase: 45 U/L (ref 38–126)
Anion gap: 4 — ABNORMAL LOW (ref 5–15)
BUN: 13 mg/dL (ref 6–20)
CALCIUM: 8 mg/dL — AB (ref 8.9–10.3)
CHLORIDE: 106 mmol/L (ref 101–111)
CO2: 26 mmol/L (ref 22–32)
Creatinine, Ser: 0.72 mg/dL (ref 0.61–1.24)
GFR calc Af Amer: >60 mL/min (ref >60)
GFR calc non Af Amer: >60 mL/min (ref >60)
Glucose, Bld: 114 mg/dL — ABNORMAL HIGH (ref 65–99)
Potassium: 3.2 mmol/L — ABNORMAL LOW (ref 3.5–5.1)
Sodium: 136 mmol/L (ref 135–145)
Total Bilirubin: 0.7 mg/dL (ref 0.3–1.2)
Total Protein: 5.4 g/dL — ABNORMAL LOW (ref 6.5–8.1)

## 2014-08-17 LAB — CBC
HEMATOCRIT: 40.4 % (ref 39.0–52.0)
Hemoglobin: 13.8 g/dL (ref 13.0–17.0)
MCH: 40.5 pg — ABNORMAL HIGH (ref 26.0–34.0)
MCHC: 34.2 g/dL (ref 30.0–36.0)
MCV: 118.5 fL — AB (ref 78.0–100.0)
Platelets: 282 10*3/uL (ref 150–400)
RBC: 3.41 MIL/uL — ABNORMAL LOW (ref 4.22–5.81)
RDW: 13.7 % (ref 11.5–15.5)
WBC: 6.3 10*3/uL (ref 4.0–10.5)

## 2014-08-17 LAB — HEMOGLOBIN A1C
HEMOGLOBIN A1C: 6 % — AB (ref 4.8–5.6)
MEAN PLASMA GLUCOSE: 126 mg/dL

## 2014-08-17 LAB — TROPONIN I

## 2014-08-17 LAB — PROTIME-INR
INR: 0.96 (ref 0.00–1.49)
PROTHROMBIN TIME: 13 s (ref 11.6–15.2)

## 2014-08-17 MED ORDER — NICOTINE 21 MG/24HR TD PT24: 21.0000 mg | MEDICATED_PATCH | Freq: Every day | TRANSDERMAL | Status: DC

## 2014-08-17 MED ORDER — PERFLUTREN LIPID MICROSPHERE
1.0000 mL | INTRAVENOUS | Status: AC | PRN
  Administered 2014-08-17: 2 mL via INTRAVENOUS
  Filled 2014-08-17: qty 10

## 2014-08-17 MED ORDER — PANTOPRAZOLE SODIUM 40 MG PO TBEC: 80.0000 mg | DELAYED_RELEASE_TABLET | Freq: Every day | ORAL | Status: DC

## 2014-08-17 MED ORDER — POTASSIUM CHLORIDE CRYS ER 20 MEQ PO TBCR
40.0000 meq | EXTENDED_RELEASE_TABLET | Freq: Once | ORAL | Status: AC
  Administered 2014-08-17: 40 meq via ORAL
  Filled 2014-08-17: qty 2

## 2014-08-17 MED ORDER — PANTOPRAZOLE SODIUM 40 MG PO TBEC: 40.0000 mg | DELAYED_RELEASE_TABLET | Freq: Two times a day (BID) | ORAL | Status: DC

## 2014-08-17 MED ORDER — SUCRALFATE 1 G PO TABS
1.0000 g | ORAL_TABLET | Freq: Three times a day (TID) | ORAL | Status: DC | PRN
  Administered 2014-08-17 (×2): 1 g via ORAL
  Filled 2014-08-17 (×2): qty 1

## 2014-08-17 NOTE — Care Management Note (Signed)
Case Management Note  Patient Details  Name: Cody [name] MRN: [id number] Date of Birth: 1965/07/05  Expected Discharge Date:                  Expected Discharge Plan:  Home/Self Care  In-House Referral:  NA  Discharge planning Services  CM Consult  Post Acute Care Choice:  NA Choice offered to:  NA  DME Arranged:    DME Agency:     HH Arranged:    [place] Agency:     Status of Service:  Completed, signed off  Medicare Important Message Given:    Date Medicare IM Given:    Medicare IM give by:    Date Additional Medicare IM Given:    Additional Medicare Important Message give by:     If discussed at [place] of Stay Meetings, dates discussed:    Additional Comments: Pt is from home, lives alone and is independent at baseline. Pt has no HH services or DME's prior to admission. Pt plans to discharge home today with self care.  Sherald Barge, RN 08/17/2014, 12:08 PM

## 2014-08-17 NOTE — Consult Note (Signed)
Referring Provider: No ref. provider found Primary Care Physician:  Edmonia James, PA-C Primary Gastroenterologist:  None in system  Date of Admission: 08/16/14 Date of Consultation: 08/17/14  Reason for Consultation:  Chest pain with possible gastritis/esophagitis etiology  HPI:  49 year old Cody [name] presented to the ER with chest pain and syncope. His ER workup did not find an etiology for his chest pain or syncope so he was admitted for further evaluation and management. He states he has a history of drinking a fifth of liquor a day and a gallon or more on the weekends. It was determined his chest pain is likely due to upper GI issue and GI was subsequently consulted. The patient states he was "3/4 drunk" on memorial day weekend, ate some sausage and had sudden and unexpected vomiting. Since then he has had persistent esophageal burning, had 2-3 days of subsequent black tarry stools, and also with persistent painful swallowing. Denies other abdominal pain, hematochezia, hematemesis, jaundice, darkened urine. At that point he made the decision to try and wean off the alcohol. He is currently down to half a pint a day. States "I know these issues are cause of how much a drink and I've done it to myself, but I'm willing to do what I need to in order to try and right things and get better." Denies any other upper or lower GI symptoms.   Past Medical History  Diagnosis Date  . Arthritis   . COPD (chronic obstructive pulmonary disease)   . Depression   . Chronic chest pain   . Hypertension   . Alcohol abuse 08/16/2014  . Tobacco abuse 08/16/2014    History reviewed. No pertinent past surgical history.  Prior to Admission medications   Medication Sig Start Date End Date Taking? Authorizing Provider  amLODipine (NORVASC) 10 MG tablet Take 10 mg by mouth. 05/23/14 05/23/15 Yes Historical Provider, MD  ibuprofen (ADVIL,MOTRIN) 200 MG tablet Take 400 mg by mouth every 6 (six) hours as needed for  moderate pain.   Yes Historical Provider, MD  lisinopril-hydrochlorothiazide (PRINZIDE,ZESTORETIC) 10-12.5 MG per tablet Take 1 tablet by mouth. 01/12/14 01/12/15 Yes Historical Provider, MD  pantoprazole (PROTONIX) 20 MG tablet Take 1 tablet (20 mg total) by mouth daily. Patient not taking: Reported on 08/16/2014 01/11/14   Pattricia Boss, MD    Current Facility-Administered Medications  Medication Dose Route Frequency Provider Last Rate Last Dose  . 0.9 %  sodium chloride infusion   Intravenous Continuous Radene Gunning, NP 10 mL/hr at 08/17/14 0848    . amLODipine (NORVASC) tablet 5 mg  5 mg Oral Daily Nimish Luther Parody, MD   5 mg at 08/17/14 0910  . nicotine (NICODERM CQ - dosed in mg/24 hours) patch 21 mg  21 mg Transdermal Daily Nimish C Anastasio Champion, MD   21 mg at 08/17/14 0912  . ondansetron (ZOFRAN) tablet 4 mg  4 mg Oral Q6H PRN Nimish Luther Parody, MD       Or  . ondansetron (ZOFRAN) injection 4 mg  4 mg Intravenous Q6H PRN Nimish C Gosrani, MD      . pantoprazole (PROTONIX) injection 40 mg  40 mg Intravenous BID AC Danie Binder, MD   40 mg at 08/17/14 0908  . sodium chloride 0.9 % injection 3 mL  3 mL Intravenous Q12H Nimish C Gosrani, MD   3 mL at 08/16/14 2200  . sucralfate (CARAFATE) tablet 1 g  1 g Oral TID PRN Deneise Lever, MD  1 g at 08/17/14 0934    Allergies as of 08/16/2014  . (No Known Allergies)    Family History  Problem Relation Age of Onset  . Cancer Other   . Diabetes Other     History   Social History  . Marital Status: Single    Spouse Name: N/A  . Number of Children: N/A  . Years of Education: N/A   Occupational History  . Not on file.   Social History Main Topics  . Smoking status: Current Every Day Smoker -- 2.00 packs/day for 35 years    Types: Cigarettes  . Smokeless tobacco: Never Used  . Alcohol Use: Yes     Comment: 1 pint of liquor a night  . Drug Use: No  . Sexual Activity: Not on file   Other Topics Concern  . Not on file   Social  History Narrative    Review of Systems: Gen: Denies fever, chills. Admits decreased appetite when he's drinking. CV: Denies recurrent chest pain. Denies heart palpitations, edema.  Resp: Denies shortness of breath with rest, cough, wheezing GI: Denies dysphagia. Admits odynophagia. Denies vomiting blood, jaundice.  MS: Denies joint pain,swelling, cramping Derm: Denies rash, itching, dry skin Psych: Denies depression, anxiety,confusion, or memory loss. Heme: Denies bruising, bleeding.  Physical Exam: Vital signs in last 24 hours: Temp:  [98.2 F (36.8 C)-98.6 F (37 C)] 98.4 F (36.9 C) (06/10 0920) Pulse Rate:  [72-100] 80 (06/10 0920) Resp:  [15-24] 18 (06/10 0920) BP: (105-125)/(53-Cody) 110/74 mmHg (06/10 0920) SpO2:  [Cody %-99 %] 99 % (06/10 0920) Weight:  [223 lb 8 oz (101.379 kg)-230 lb (104.327 kg)] 223 lb 8 oz (101.379 kg) (06/09 1713) Last BM Date: 08/16/14 General:   Alert,  Well-developed, well-nourished, pleasant and cooperative in NAD Head:  Normocephalic and atraumatic. Eyes:  Sclera clear, no icterus. Conjunctiva pink. Ears:  Normal auditory acuity. Lungs:  Clear throughout to auscultation.   No wheezes, crackles, or rhonchi. No acute distress. Heart:  Regular rate and rhythm; no murmurs, clicks, rubs,  or gallops. Abdomen:  Soft, rounded, and nondistended. Mild epigastric TTP. No masses, hepatosplenomegaly or hernias noted. Normal bowel sounds, without guarding, and without rebound.   Rectal:  Deferred.   Pulses:  Normal DP pulses noted. Extremities:  Without clubbing or edema. Neurologic:  Alert and  oriented x4;  grossly normal neurologically. Skin:  Intact without significant lesions or rashes. Psych:  Alert and cooperative. Normal mood and affect.  Intake/Output from previous day: 06/09 0701 - 06/10 0700 In: 360 [P.O.:360] Out: -  Intake/Output this shift: Total I/O In: 240 [P.O.:240] Out: -   Lab Results:  Recent Labs  08/16/14 1213 08/17/14 0623   WBC 6.3 6.3  HGB 16.4 13.8  HCT 43.3 40.4  PLT 304 282   BMET  Recent Labs  08/16/14 1213 08/17/14 0623  NA 135 136  K 3.4* 3.2*  CL 95* 106  CO2 29 26  GLUCOSE 147* 114*  BUN 18 13  CREATININE 1.13 0.72  CALCIUM 9.4 8.0*   LFT  Recent Labs  08/16/14 1213 08/17/14 0623  PROT 6.9 5.4*  ALBUMIN 3.4* 2.7*  AST 93* 49*  ALT 100* 69*  ALKPHOS 56 45  BILITOT 1.2 0.7  BILIDIR 0.3  --   IBILI 0.9  --    PT/INR  Recent Labs  08/17/14 0623  LABPROT 13.0  INR 0.96   Hepatitis Panel No results for input(s): HEPBSAG, HCVAB, HEPAIGM, HEPBIGM in the last 72  hours. C-Diff No results for input(s): CDIFFTOX in the last 72 hours.  Studies/Results: Dg Chest Port 1 View  08/16/2014   CLINICAL DATA:  Centralized chest pain for 1 week. Difficulty swallowing.  EXAM: PORTABLE CHEST - 1 VIEW  COMPARISON:  01/11/2014  FINDINGS: Stable cardiomegaly without edema, definite pneumonia, collapse or consolidation. No effusion pneumothorax. Azygos fissure in the right upper lobe noted, normal variant. No acute osseous finding. Healed upper left rib fractures.  IMPRESSION: Cardiomegaly without acute process.   Electronically Signed   By: Jerilynn Mages.  Shick M.D.   On: 08/16/2014 12:48    Impression: 49 year old male with a history of alcoholism with recent possible melena about a month ago, now resolved. Currently H/H are normal. Has had persistent esophageal burning and painful swallowing. Is not on a PPI at home. His AST/ALT are slightly elevated, other liver function labs normal. Given his presentation, social history, and symptoms he likely has ETOH-associated gastritis but cannot rule out PUD. He will likely need outpatient EGD for further evaluation.   Plan: 1. Continue PPI. 2. Discharge home on bid PPI 3. We will schedule him for an outpatient EGD in the OR with propofol due to ETOH history. The risks, benefits, and alternatives have been discussed in detail with the patient. He states  understanding and desire to proceed.  4. Encourage continued weaning and abstinence from ETOH 5. Could benefit from outpatient limited RUQ U/S to evaluate his liver given bump in LFTs and ETOH history.    Walden Field, AGNP-C Adult & Gerontological Nurse Practitioner [facility]    LOS: 1 day     08/17/2014, 10:31 AM

## 2014-08-17 NOTE — Discharge Summary (Deleted)
Physician Discharge Summary  Cody [name] VZS:[id number] DOB: 13-Nov-1965 DOA: 08/16/2014  PCP: Stephens Shire, MD  Admit date: 08/16/2014 Discharge date: 08/17/2014  Recommendations for Outpatient Follow-up:  1. Atypical chest pain, likely GI (suspect alcoholic esophagitis/gastric s/p vomiting episode and heavy alcohol use. GI plans EGD next week and will contact patient. 2. Norvasc and HCTZ/ACE-I on hold (patient hasn't taken in 2 weeks secondary to low blood pressure. Follow-up blood pressure as an outpatient. 3. Continue to encourage EtOH and smoking cessation. 4. See echocardiogram report below: "There is flattening of the interventricular septum intermittently in diastole and systole, presumably during inspiration. Finding in this setting in the absence ofpericardial effusion or restrictive diastolic dysfunction may bedue to RV dysfunction. Consider limited study with respirometer.  Discharge Diagnoses:  1. Atypical chest pain 2. Epigastric pain, odynophagia 3. Hypokalemia 4. Elevated transaminases 5. Alcohol abuse 6. Smoker   Discharge Condition: improved Disposition: home  Diet recommendation: regular  Filed Weights   08/16/14 1212 08/16/14 1713  Weight: 104.327 kg (230 lb) 101.379 kg (223 lb 8 oz)    History of present illness:  49yom presented with epigastric pain, chest pain, one episode of syncope several days before admission (with severe coughing episode). H/o heavy alcohol use. Pain brought on by swallowing and eating. Admitted for chest pain, epigastric pain.  Hospital Course:  Cody [name] was observed overnight. ACS was ruled out. Telemetry was unrevealing and echocardiogram was reassuring. His history is highly suggestive of GI origin. Specifically has significant alcohol intake. He was at [employer] when he has severe coughing event and passed out. He had vomiting and subsequent to this he has had pain with swallowing and eating. He has no features to suggest  cardiac disease.  1. Atypical chest pain. ACS ruled out. Non-cardiac. GI in origin. 2. Epigastric pain, suspect esophagitis s/p vomiting and significant alcohol intake. CXR unremarkable. Benign exam. GI saw and plans outpatient EGD 3. Hypokalemia. Repleted. 4. Elevated transaminases. Trending down. Secondary to alcohol abuse. 5. Syncopal episode in the last week. Vasovagal/cough induced. No further evaluation suggested. 6. Hypertension. He reports his blood pressures been running low for the last few weeks and he stopped his antihypertensives. His blood pressures normal controlled here without medication. I asked him to continue to hold his medications until he follows up with his primary care physician. 7. Alcohol abuse. Recommended abstinence. He has a sponsor. He is decreasing his alcohol consumption with goal of quiting. 8. Smoker. He's interested in quitting and finds the patch to work well.   Stable. ACS ruled out.   Plan home today with outpatient GI follow-up next week for EGD. In meantime, empiric PPI BID, no ibuprofen.   Stop alcohol and smoking  F/u possible RV dysfunction as an outpatient. Suspect related to alcohol use.  Consultants:  GI  Procedures:  2d echocardiogram Study Conclusions  - Left ventricle: The cavity size was normal. Wall thickness was normal. Systolic function was normal. The estimated ejection fraction was in the range of 60% to 65%. Doppler parameters are consistent with abnormal left ventricular relaxation (grade 1 diastolic dysfunction). - Aortic valve: Mildly calcified annulus. Trileaflet; normal thickness leaflets. Valve area (VTI): 3.28 cm^2. Valve area (Vmax): 3.61 cm^2. - Left atrium: The atrium was mildly to moderately dilated. - Right ventricle: There is flattening of the interventricular septum intermittently in diastole and systole, presumably during inspiration. Finding in this setting in the absence  of pericardial effusion or restrictive diastolic dysfunction may be due to RV  dysfunction. Consider limited study with respirometer. Not well visualized.Grossly the RV appears enlarged. By TAPSE and anular tissue Doppler velocity RV function is mildly to moderately decreased. TAPSE: 12.2 mm . Lateral annulus peak S velocity: 9.68 cm/s. - Technically difficult study. Echocontrast was used to enhance visualization.  Discharge Instructions  Discharge Instructions    Activity as tolerated - No restrictions    Complete by:  As directed      Diet general    Complete by:  As directed      Discharge instructions    Complete by:  As directed   Call your physician or seek immediate medical attention for pain, vomiting, chest pain, shortness of breath or worsening of condition.          Discharge Medication List as of 08/17/2014  1:28 PM    START taking these medications   Details  nicotine (NICODERM CQ - DOSED IN MG/24 HOURS) 21 mg/24hr patch Place 1 patch (21 mg total) onto the skin daily., Starting 08/17/2014, Until Discontinued, Normal      CONTINUE these medications which have CHANGED   Details  pantoprazole (PROTONIX) 40 MG tablet Take 1 tablet (40 mg total) by mouth 2 (two) times daily., Starting 08/17/2014, Until Discontinued, Print      STOP taking these medications     amLODipine (NORVASC) 10 MG tablet      ibuprofen (ADVIL,MOTRIN) 200 MG tablet      lisinopril-hydrochlorothiazide (PRINZIDE,ZESTORETIC) 10-12.5 MG per tablet        No Known Allergies  The results of significant diagnostics from this hospitalization (including imaging, microbiology, ancillary and laboratory) are listed below for reference.    Significant Diagnostic Studies: Dg Chest Port 1 View  08/16/2014   CLINICAL DATA:  Centralized chest pain for 1 week. Difficulty swallowing.  EXAM: PORTABLE CHEST - 1 VIEW  COMPARISON:  01/11/2014  FINDINGS: Stable cardiomegaly without edema, definite  pneumonia, collapse or consolidation. No effusion pneumothorax. Azygos fissure in the right upper lobe noted, normal variant. No acute osseous finding. Healed upper left rib fractures.  IMPRESSION: Cardiomegaly without acute process.   Electronically Signed   By: Jerilynn Mages.  Shick M.D.   On: 08/16/2014 12:48    Labs: Basic Metabolic Panel:  Recent Labs Lab 08/16/14 1213 08/17/14 0623  NA 135 136  K 3.4* 3.2*  CL 95* 106  CO2 29 26  GLUCOSE 147* 114*  BUN 18 13  CREATININE 1.13 0.72  CALCIUM 9.4 8.0*   Liver Function Tests:  Recent Labs Lab 08/16/14 1213 08/17/14 0623  AST 93* 49*  ALT 100* 69*  ALKPHOS 56 45  BILITOT 1.2 0.7  PROT 6.9 5.4*  ALBUMIN 3.4* 2.7*    Recent Labs Lab 08/16/14 1213  LIPASE 53*   CBC:  Recent Labs Lab 08/16/14 1213 08/17/14 0623  WBC 6.3 6.3  HGB 16.4 13.8  HCT 43.3 40.4  MCV 113.6* 118.5*  PLT 304 282   Cardiac Enzymes:  Recent Labs Lab 08/16/14 1213 08/16/14 1722 08/16/14 2226 08/17/14 0623  TROPONINI <0.03 <0.03 <0.03 <0.03    Principal Problem:   Syncope Active Problems:   Chest pain   COPD (chronic obstructive pulmonary disease)   Alcohol abuse   Tobacco abuse   Hypertension   Time coordinating discharge: 35 minutes  Signed:  Murray Hodgkins, MD [facility] 08/17/2014, 3:10 PM

## 2014-08-17 NOTE — Telephone Encounter (Signed)
Please call the patient on Monday morning to schedule an EGD in the OR with propofol with [facility] (preferably sometime this week.) Let me know if any questions.

## 2014-08-17 NOTE — Progress Notes (Signed)
PROGRESS NOTE  Cody [name] MWN:[id number] DOB: 03/18/1965 DOA: 08/16/2014 PCP: Stephens Shire, MD  Summary: 49yom presented with epigastric pain, chest pain, one episode of syncope several days before admission (with severe coughing episode). H/o heavy alcohol use. Pain brought on by swallowing and eating. Admitted for chest pain, epigastric pain.  Assessment/Plan: 1. Atypical chest pain. Non-cardiac. GI in origin. 2. Epigastric pain, suspect esophagitis s/p vomiting and significant alcohol intake. CXR unremarkable. Benign exam. GI saw and plans outpatient EGD 3. Hypokalemia. Replete. 4. Elevated transaminases. Trending down. Secondary to alcohol abuse. 5. Alcohol abuse. Recommended abstinence. He has a sponsor. He is decreasing his alcohol consumption with goal of quiting. 6. Smoker. He's interested in quitting and finds the patch to work well.   Stable. ACS ruled out.   Plan home today with outpatient GI follow-up next week for EGD. In meantime, empiric PPI BID, no ibuprofen.   Stop alcohol and smoking  F/u possible RV dysfunction as an outpatient. Possibly related to alcohol use.  Murray Hodgkins, MD  [facility]  Pager [phone] If 7PM-7AM, please contact night-coverage at [email or web address], password [facility] 08/17/2014, 2:57 PM  LOS: 1 day   Consultants:  GI  Procedures:    Antibiotics:    HPI/Subjective: Feels better. No CP or SOB. Still has some pain with swallowing or with food. Wants to go home.  Objective: Filed Vitals:   08/16/14 1713 08/16/14 2113 08/17/14 0510 08/17/14 0920  BP: 122/76 115/78 115/80 110/74  Pulse: 87 81 74 80  Temp: 98.2 F (36.8 C) 98.6 F (37 C) 98.2 F (36.8 C) 98.4 F (36.9 C)  TempSrc: Oral Oral Oral Oral  Resp: 16 15 16 18   Height: 5\' 10"  (1.778 m)     Weight: 101.379 kg (223 lb 8 oz)     SpO2: 98% 97% 98% 99%    Intake/Output Summary (Last 24 hours) at 08/17/14 1457 Last data filed at 08/17/14 1314  Gross per 24 hour    Intake    840 ml  Output      0 ml  Net    840 ml     Filed Weights   08/16/14 1212 08/16/14 1713  Weight: 104.327 kg (230 lb) 101.379 kg (223 lb 8 oz)    Exam:     Afebrile, VSS, no orthostasis, no hypoxia General:  Appears calm and comfortable Cardiovascular: RRR, no m/r/g. No LE edema. Telemetry: SR, no arrhythmias  Respiratory: CTA bilaterally, no w/r/r. Normal respiratory effort. Abdomen: soft, ntnd Psychiatric: grossly normal mood and affect, speech fluent and appropriate  Data reviewed:  K+ 3.2  AST, ALT trending down  Lipase minimally elevated at 53  Troponins negative  CBC unremarkable. Hgb WNL.  TSH normal  CXR NAD  EKG SR, PVC  2d echocardiogram Study Conclusions  - Left ventricle: The cavity size was normal. Wall thickness was normal. Systolic function was normal. The estimated ejection fraction was in the range of 60% to 65%. Doppler parameters are consistent with abnormal left ventricular relaxation (grade 1 diastolic dysfunction). - Aortic valve: Mildly calcified annulus. Trileaflet; normal thickness leaflets. Valve area (VTI): 3.28 cm^2. Valve area (Vmax): 3.61 cm^2. - Left atrium: The atrium was mildly to moderately dilated. - Right ventricle: There is flattening of the interventricular septum intermittently in diastole and systole, presumably during inspiration. Finding in this setting in the absence of pericardial effusion or restrictive diastolic dysfunction may be due to RV dysfunction. Consider limited study with respirometer. Not well visualized.Grossly the RV appears  enlarged. By TAPSE and anular tissue Doppler velocity RV function is mildly to moderately decreased. TAPSE: 12.2 mm . Lateral annulus peak S velocity: 9.68 cm/s. - Technically difficult study. Echocontrast was used to enhance visualization.  Scheduled Meds: . amLODipine  5 mg Oral Daily  . nicotine  21 mg Transdermal Daily  .  pantoprazole  80 mg Oral Daily  . sodium chloride  3 mL Intravenous Q12H   Continuous Infusions: . sodium chloride 10 mL/hr at 08/17/14 0848    Principal Problem:   Syncope Active Problems:   Chest pain   COPD (chronic obstructive pulmonary disease)   Alcohol abuse   Tobacco abuse   Hypertension

## 2014-08-17 NOTE — Discharge Summary (Addendum)
Physician Discharge Summary  Cody [name] JKD:[id number] DOB: 1965/11/03 DOA: 08/16/2014  PCP: Stephens Shire, MD  Admit date: 08/16/2014 Discharge date: 08/17/2014  Recommendations for Outpatient Follow-up:  1. Atypical chest pain, likely GI (suspect alcoholic esophagitis/gastric s/p vomiting episode and heavy alcohol use. GI plans EGD next week and will contact patient. 2. Norvasc and HCTZ/ACE-I on hold (patient hasn't taken in 2 weeks secondary to low blood pressure. Follow-up blood pressure as an outpatient. 3. Continue to encourage EtOH and smoking cessation. 4. See echocardiogram report below: "There is flattening of the interventricular septum intermittently in diastole and systole, presumably during inspiration. Finding in this setting in the absence ofpericardial effusion or restrictive diastolic dysfunction may bedue to RV dysfunction. Consider limited study with respirometer.  Discharge Diagnoses:  1. Atypical chest pain 2. Epigastric pain, odynophagia 3. Hypokalemia 4. Elevated transaminases 5. Alcohol abuse 6. Smoker   Discharge Condition: improved Disposition: home  Diet recommendation: regular  Filed Weights   08/16/14 1212 08/16/14 1713  Weight: 104.327 kg (230 lb) 101.379 kg (223 lb 8 oz)    History of present illness:  49yom presented with epigastric pain, chest pain, one episode of syncope several days before admission (with severe coughing episode). H/o heavy alcohol use. Pain brought on by swallowing and eating. Admitted for chest pain, epigastric pain.  Hospital Course:  Cody [name] was observed overnight. ACS was ruled out. Telemetry was unrevealing and echocardiogram was reassuring. His history is highly suggestive of GI origin. Specifically has significant alcohol intake. He was at [employer] when he has severe coughing event and passed out. He had vomiting and subsequent to this he has had pain with swallowing and eating. He has no features to suggest  cardiac disease.  1. Atypical chest pain. ACS ruled out. Non-cardiac. GI in origin. 2. Epigastric pain, suspect esophagitis s/p vomiting and significant alcohol intake. CXR unremarkable. Benign exam. GI saw and plans outpatient EGD.lipase was minimally elevated, history not suggestive of pancreatitis. 3. Hypokalemia. Repleted. 4. Elevated transaminases. Trending down. Secondary to alcohol abuse. 5. Syncopal episode in the last week. Vasovagal/cough induced. No further evaluation suggested. 6. Hypertension. He reports his blood pressures been running low for the last few weeks and he stopped his antihypertensives. His blood pressures normal controlled here without medication. I asked him to continue to hold his medications until he follows up with his primary care physician. 7. Alcohol abuse. Recommended abstinence. He has a sponsor. He is decreasing his alcohol consumption with goal of quiting. 8. Smoker. He's interested in quitting and finds the patch to work well.   Stable. ACS ruled out.   Plan home today with outpatient GI follow-up next week for EGD. In meantime, empiric PPI BID, no ibuprofen.   Stop alcohol and smoking  F/u possible RV dysfunction as an outpatient. Suspect related to alcohol use.  Consultants:  GI  Procedures:  2d echocardiogram Study Conclusions  - Left ventricle: The cavity size was normal. Wall thickness was normal. Systolic function was normal. The estimated ejection fraction was in the range of 60% to 65%. Doppler parameters are consistent with abnormal left ventricular relaxation (grade 1 diastolic dysfunction). - Aortic valve: Mildly calcified annulus. Trileaflet; normal thickness leaflets. Valve area (VTI): 3.28 cm^2. Valve area (Vmax): 3.61 cm^2. - Left atrium: The atrium was mildly to moderately dilated. - Right ventricle: There is flattening of the interventricular septum intermittently in diastole and systole, presumably  during inspiration. Finding in this setting in the absence of pericardial effusion or  restrictive diastolic dysfunction may be due to RV dysfunction. Consider limited study with respirometer. Not well visualized.Grossly the RV appears enlarged. By TAPSE and anular tissue Doppler velocity RV function is mildly to moderately decreased. TAPSE: 12.2 mm . Lateral annulus peak S velocity: 9.68 cm/s. - Technically difficult study. Echocontrast was used to enhance visualization.  Discharge Instructions  Discharge Instructions    Activity as tolerated - No restrictions    Complete by:  As directed      Diet general    Complete by:  As directed      Discharge instructions    Complete by:  As directed   Call your physician or seek immediate medical attention for pain, vomiting, chest pain, shortness of breath or worsening of condition.          Discharge Medication List as of 08/17/2014  1:28 PM    START taking these medications   Details  nicotine (NICODERM CQ - DOSED IN MG/24 HOURS) 21 mg/24hr patch Place 1 patch (21 mg total) onto the skin daily., Starting 08/17/2014, Until Discontinued, Normal      CONTINUE these medications which have CHANGED   Details  pantoprazole (PROTONIX) 40 MG tablet Take 1 tablet (40 mg total) by mouth 2 (two) times daily., Starting 08/17/2014, Until Discontinued, Print      STOP taking these medications     amLODipine (NORVASC) 10 MG tablet      ibuprofen (ADVIL,MOTRIN) 200 MG tablet      lisinopril-hydrochlorothiazide (PRINZIDE,ZESTORETIC) 10-12.5 MG per tablet        No Known Allergies  The results of significant diagnostics from this hospitalization (including imaging, microbiology, ancillary and laboratory) are listed below for reference.    Significant Diagnostic Studies: Dg Chest Port 1 View  08/16/2014   CLINICAL DATA:  Centralized chest pain for 1 week. Difficulty swallowing.  EXAM: PORTABLE CHEST - 1 VIEW  COMPARISON:   01/11/2014  FINDINGS: Stable cardiomegaly without edema, definite pneumonia, collapse or consolidation. No effusion pneumothorax. Azygos fissure in the right upper lobe noted, normal variant. No acute osseous finding. Healed upper left rib fractures.  IMPRESSION: Cardiomegaly without acute process.   Electronically Signed   By: Jerilynn Mages.  Shick M.D.   On: 08/16/2014 12:48    Labs: Basic Metabolic Panel:  Recent Labs Lab 08/16/14 1213 08/17/14 0623  NA 135 136  K 3.4* 3.2*  CL 95* 106  CO2 29 26  GLUCOSE 147* 114*  BUN 18 13  CREATININE 1.13 0.72  CALCIUM 9.4 8.0*   Liver Function Tests:  Recent Labs Lab 08/16/14 1213 08/17/14 0623  AST 93* 49*  ALT 100* 69*  ALKPHOS 56 45  BILITOT 1.2 0.7  PROT 6.9 5.4*  ALBUMIN 3.4* 2.7*    Recent Labs Lab 08/16/14 1213  LIPASE 53*   CBC:  Recent Labs Lab 08/16/14 1213 08/17/14 0623  WBC 6.3 6.3  HGB 16.4 13.8  HCT 43.3 40.4  MCV 113.6* 118.5*  PLT 304 282   Cardiac Enzymes:  Recent Labs Lab 08/16/14 1213 08/16/14 1722 08/16/14 2226 08/17/14 0623  TROPONINI <0.03 <0.03 <0.03 <0.03    Principal Problem:   Syncope Active Problems:   Chest pain   COPD (chronic obstructive pulmonary disease)   Alcohol abuse   Tobacco abuse   Hypertension   Time coordinating discharge: 35 minutes  Signed:  Murray Hodgkins, MD [facility] 08/17/2014, 3:10 PM

## 2014-08-17 NOTE — Progress Notes (Signed)
  Echocardiogram 2D Echocardiogram has been performed.  Darlina Sicilian M 08/17/2014, 12:44 PM

## 2014-08-18 LAB — MAGNESIUM: MAGNESIUM: 1.5 mg/dL — AB (ref 1.7–2.4)

## 2014-08-18 LAB — LIPASE, BLOOD: LIPASE: 58 U/L — AB (ref 22–51)

## 2014-08-20 ENCOUNTER — Encounter (HOSPITAL_COMMUNITY): Payer: Self-pay

## 2014-08-20 ENCOUNTER — Other Ambulatory Visit: Payer: Self-pay

## 2014-08-20 ENCOUNTER — Telehealth: Payer: Self-pay

## 2014-08-20 ENCOUNTER — Encounter (HOSPITAL_COMMUNITY)
Admission: RE | Admit: 2014-08-20 | Discharge: 2014-08-20 | Disposition: A | Discharge status: Home / Self Care | Payer: 59 | Source: Ambulatory Visit | Attending: Gastroenterology | Admitting: Gastroenterology

## 2014-08-20 DIAGNOSIS — Z01818 Encounter for other preprocedural examination: Secondary | ICD-10-CM | POA: Diagnosis present

## 2014-08-20 LAB — BASIC METABOLIC PANEL
Anion gap: 7 (ref 5–15)
BUN: 14 mg/dL (ref 6–20)
CALCIUM: 8.7 mg/dL — AB (ref 8.9–10.3)
CHLORIDE: 105 mmol/L (ref 101–111)
CO2: 23 mmol/L (ref 22–32)
Creatinine, Ser: 1.06 mg/dL (ref 0.61–1.24)
GFR calc Af Amer: >60 mL/min (ref >60)
GFR calc non Af Amer: >60 mL/min (ref >60)
GLUCOSE: 109 mg/dL — AB (ref 65–99)
POTASSIUM: 4.2 mmol/L (ref 3.5–5.1)
SODIUM: 135 mmol/L (ref 135–145)

## 2014-08-20 NOTE — Telephone Encounter (Signed)
Noted, thanks. Please have him scheduled as soon as we can get the referral.

## 2014-08-20 NOTE — Telephone Encounter (Signed)
Spoke with pt and he is set for procedure on 08/21/2014. Pt has pre-op appt. Today at 1:45pm. Pt is aware of preparation for procedure

## 2014-08-20 NOTE — Patient Instructions (Addendum)
Cody [name]  08/20/2014    Your procedure is scheduled on 08/21/14.  Report to [facility] at 7:00 A.M.  Call this number if you have problems the morning of surgery:  [phone]   Remember:  Do not eat food or drink liquids after midnight.  Take these medicines the morning of surgery with A SIP OF WATER : Pantoprazole   Do not wear jewelry, make-up or nail polish.  Do not wear lotions, powders, or perfumes.  You may wear deodorant.  Do not shave 48 hours prior to surgery.  Men may shave face and neck.  Do not bring valuables to the hospital.  [facility] is not responsible for any belongings or valuables.  Contacts, dentures or bridgework may not be worn into surgery.  Leave your suitcase in the car.  After surgery it may be brought to your room.  For patients admitted to the hospital, discharge time will be determined by your treatment team.  Patients discharged the day of surgery will not be allowed to drive home.   Please read over the following fact sheets that you were given. Anesthesia Post-op Instructions    Esophagogastroduodenoscopy Esophagogastroduodenoscopy (EGD) is a procedure to examine the lining of the esophagus, stomach, and first part of the small intestine (duodenum). A long, flexible, lighted tube with a camera attached (endoscope) is inserted down the throat to view these organs. This procedure is done to detect problems or abnormalities, such as inflammation, bleeding, ulcers, or growths, in order to treat them. The procedure lasts about 5-20 minutes. It is usually an outpatient procedure, but it may need to be performed in emergency cases in the hospital. LET YOUR CAREGIVER KNOW ABOUT:   Allergies to food or medicine.  All medicines you are taking, including vitamins, herbs, eyedrops, and over-the-counter medicines and creams.  Use of steroids (by mouth or creams).  Previous problems you or members of your family have had with the use of  anesthetics.  Any blood disorders you have.  Previous surgeries you have had.  Other health problems you have.  Possibility of pregnancy, if this applies. RISKS AND COMPLICATIONS  Generally, EGD is a safe procedure. However, as with any procedure, complications can occur. Possible complications include:  Infection.  Bleeding.  Tearing (perforation) of the esophagus, stomach, or duodenum.  Difficulty breathing or not being able to breath.  Excessive sweating.  Spasms of the larynx.  Slowed heartbeat.  Low blood pressure. BEFORE THE PROCEDURE  Do not eat or drink anything for 6-8 hours before the procedure or as directed by your caregiver.  Ask your caregiver about changing or stopping your regular medicines.  If you wear dentures, be prepared to remove them before the procedure.  Arrange for someone to drive you home after the procedure. PROCEDURE   A vein will be accessed to give medicines and fluids. A medicine to relax you (sedative) and a pain reliever will be given through that access into the vein.  A numbing medicine (local anesthetic) may be sprayed on your throat for comfort and to stop you from gagging or coughing.  A mouth guard may be placed in your mouth to protect your teeth and to keep you from biting on the endoscope.  You will be asked to lie on your left side.  The endoscope is inserted down your throat and into the esophagus, stomach, and duodenum.  Air is put through the endoscope to allow your caregiver to view the lining of your  esophagus clearly.  The esophagus, stomach, and duodenum is then examined. During the exam, your caregiver may:  Remove tissue to be examined under a microscope (biopsy) for inflammation, infection, or other medical problems.  Remove growths.  Remove objects (foreign bodies) that are stuck.  Treat any bleeding with medicines or other devices that stop tissues from bleeding (hot cautery, clipping devices).  Widen  (dilate) or stretch narrowed areas of the esophagus and stomach.  The endoscope will then be withdrawn. AFTER THE PROCEDURE  You will be taken to a recovery area to be monitored. You will be able to go home once you are stable and alert.  Do not eat or drink anything until the local anesthetic and numbing medicines have worn off. You may choke.  It is normal to feel bloated, have pain with swallowing, or have a sore throat for a short time. This will wear off.  Your caregiver should be able to discuss his or her findings with you. It will take longer to discuss the test results if any biopsies were taken. Document Released: 06/26/2004 Document Revised: 07/10/2013 Document Reviewed: 01/27/2012 [facility] Patient Information 2015 [place], [place]. This information is not intended to replace advice given to you by your health care provider. Make sure you discuss any questions you have with your health care provider.    PATIENT INSTRUCTIONS POST-ANESTHESIA  IMMEDIATELY FOLLOWING SURGERY:  Do not drive or operate machinery for the first twenty four hours after surgery.  Do not make any important decisions for twenty four hours after surgery or while taking narcotic pain medications or sedatives.  If you develop intractable nausea and vomiting or a severe headache please notify your doctor immediately.  FOLLOW-UP:  Please make an appointment with your surgeon as instructed. You do not need to follow up with anesthesia unless specifically instructed to do so.  WOUND CARE INSTRUCTIONS (if applicable):  Keep a dry clean dressing on the anesthesia/puncture wound site if there is drainage.  Once the wound has quit draining you may leave it open to air.  Generally you should leave the bandage intact for twenty four hours unless there is drainage.  If the epidural site drains for more than 36-48 hours please call the anesthesia department.  QUESTIONS?:  Please feel free to call your physician or the  hospital operator if you have any questions, and they will be happy to assist you.

## 2014-08-20 NOTE — Telephone Encounter (Signed)
REVIEWED-NO ADDITIONAL RECOMMENDATIONS. 

## 2014-08-20 NOTE — Telephone Encounter (Signed)
Pt insurance will not cover him to have the EGD because he has to have a referral from his PCP. He has not seen his PCP. He has an appointment to see his PCP on Friday and then they can send [place] a referral for the EGD.

## 2014-08-20 NOTE — Telephone Encounter (Signed)
[facility] called Eustaquio Maize) she states that pt needs a Pre-cert for EGD. Spoke with Romie Minus @ [facility]. Pending PA# is [phone].  Called and left Beth a message.

## 2014-08-21 LAB — FOLATE RBC
FOLATE, RBC: 462 ng/mL — AB (ref >498)
Folate, Hemolysate: 190.5 ng/mL
Hematocrit: 41.2 % (ref 37.5–51.0)

## 2014-08-27 ENCOUNTER — Telehealth: Payer: Self-pay

## 2014-08-27 NOTE — Telephone Encounter (Signed)
Pa# for EGD is [phone]

## 2014-08-28 ENCOUNTER — Ambulatory Visit (HOSPITAL_COMMUNITY): Admission: RE | Admit: 2014-08-28 | Payer: 59 | Source: Ambulatory Visit | Admitting: Gastroenterology

## 2014-08-28 ENCOUNTER — Telehealth: Payer: Self-pay

## 2014-08-28 SURGERY — ESOPHAGOGASTRODUODENOSCOPY (EGD) WITH PROPOFOL
Anesthesia: Monitor Anesthesia Care

## 2014-08-28 NOTE — Telephone Encounter (Signed)
[facility] EGD W/ OPV

## 2014-08-28 NOTE — Telephone Encounter (Signed)
Pt called to cancel his EGD because he got called into work last night and he is just now getting home. He wants to get rescheduled if he can.

## 2014-08-30 NOTE — Telephone Encounter (Signed)
He will check his schedule and call back to get set up

## 2014-08-30 NOTE — Telephone Encounter (Signed)
Copy made for file

## 2014-09-13 ENCOUNTER — Institutional Professional Consult (permissible substitution): Payer: 59 | Admitting: Pulmonary Disease

## 2014-09-24 ENCOUNTER — Institutional Professional Consult (permissible substitution): Payer: 59 | Admitting: Pulmonary Disease

## 2014-09-25 NOTE — Telephone Encounter (Signed)
REFERRAL SENT TO INBOX FOR SCREENING TCS.

## 2014-09-25 NOTE — Telephone Encounter (Signed)
I received a call from the pcp to make an appointment here and he has an appointment with Neil Crouch 10/19/14

## 2014-10-03 ENCOUNTER — Ambulatory Visit (INDEPENDENT_AMBULATORY_CARE_PROVIDER_SITE_OTHER): Payer: 59 | Admitting: Pulmonary Disease

## 2014-10-03 ENCOUNTER — Encounter: Payer: Self-pay | Admitting: Pulmonary Disease

## 2014-10-03 ENCOUNTER — Institutional Professional Consult (permissible substitution): Payer: 59 | Admitting: Pulmonary Disease

## 2014-10-03 VITALS — BP 130/84 | HR 106 | Ht 70.0 in | Wt 216.6 lb

## 2014-10-03 DIAGNOSIS — J449 Chronic obstructive pulmonary disease, unspecified: Secondary | ICD-10-CM | POA: Diagnosis not present

## 2014-10-03 DIAGNOSIS — R0602 Shortness of breath: Secondary | ICD-10-CM | POA: Diagnosis not present

## 2014-10-03 DIAGNOSIS — Z720 Tobacco use: Secondary | ICD-10-CM

## 2014-10-03 NOTE — Patient Instructions (Signed)
Lung function has dropped to 70% Ok to use albuterol MDI 2 puffs every 6 as needed You have to [place] smoking Call 1800-QUITNOW for support OK to start on nicoderm patches 21 mg/day x 3 months We can look into sleep problems once your current issues resolved

## 2014-10-03 NOTE — Assessment & Plan Note (Signed)
You have to [place] smoking Call 1800-QUITNOW for support OK to start on nicoderm patches 21 mg/day x 3 months

## 2014-10-03 NOTE — Assessment & Plan Note (Signed)
Lung function has dropped to 70% Ok to use albuterol MDI 2 puffs every 6 as needed We can look into sleep problems once your current issues resolved

## 2014-10-03 NOTE — Progress Notes (Signed)
Subjective:    Patient ID: Cody [name], male    DOB: [birth year], 49 y.o.   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 He smokes about a pack per day since a teenager-about 35 pack years. He reports increased dyspnea on exertion for a few weeks, especially in the hot weather. Albuterol seems to relieve partially. He is otherwise limited in walking due to a painful left hip. He reports clear sputum especially in the mornings, and a chronic cough. He denies wheezing, pedal edema, orthopnea or paroxysmal nocturnal dyspnea. He has well-controlled hypertension. He had an ED visit on 08/16/14 for black stools, hemoglobin was 13.8 drop from his baseline of 16. A GI consultation is pending. He is a recovering alcoholic and was sober for a few months but had a relapse a week ago. He denies any knowledge of liver disease  Spirometry showed a ratio 77, FEV1 of 71% and FVC of 73% suggesting mild restriction. Smaller airways were decreased at 70%.  He also reports loud snoring, no bed partner history is available for witnessed apneas. He does report non-refreshing sleep and frequent nocturnal awakenings. He has used alcohol to help him sleep better.  Past Medical History  Diagnosis Date  . Arthritis   . COPD (chronic obstructive pulmonary disease)   . Depression   . Chronic chest pain   . Hypertension   . Alcohol abuse 08/16/2014  . Tobacco abuse 08/16/2014    No past surgical history on file.  No Known Allergies  History   Social History  . Marital Status: Single    Spouse Name: N/A  . Number of Children: 0  . Years of Education: N/A   Occupational History  . Dealer    Social History Main Topics  . Smoking status: Current Every Day Smoker -- 1.00 packs/day for 35 years    Types: Cigarettes  . Smokeless tobacco: Never Used  . Alcohol Use: No     Comment: 1 pint of liquor at night; pt currently trying to stop drinking    . Drug Use: No  . Sexual Activity: Not on file   Other Topics Concern  . Not on file   Social History Narrative    Family History  Problem Relation Age of Onset  . Cancer Other   . Diabetes Other   . COPD Father      Review of Systems  Constitutional: Negative for fever, chills, activity change, appetite change and unexpected weight change.  HENT: Negative for congestion, dental problem, postnasal drip, rhinorrhea, sneezing, sore throat, trouble swallowing and voice change.   Eyes: Negative for visual disturbance.  Respiratory: Positive for cough and shortness of breath. Negative for choking.   Cardiovascular: Negative for leg swelling.  Gastrointestinal: Negative for nausea, vomiting and abdominal pain.  Genitourinary: Negative for difficulty urinating.  Musculoskeletal: Negative for arthralgias.  Skin: Negative for rash.  Psychiatric/Behavioral: Negative for behavioral problems and confusion.       Objective:   Physical Exam  Gen. Pleasant, well-nourished, in no distress, normal affect ENT - no lesions, no post nasal drip Neck: No JVD, no thyromegaly, no carotid bruits Lungs: no use of accessory muscles, no dullness to percussion, decreased without rales or rhonchi  Cardiovascular: Rhythm regular, heart sounds  normal, no murmurs or gallops, no peripheral edema Abdomen: soft and non-tender, no hepatosplenomegaly, BS normal. Musculoskeletal: No deformities, no cyanosis or clubbing Neuro:  alert, non focal  Assessment & Plan:

## 2014-10-19 ENCOUNTER — Encounter: Payer: Self-pay | Admitting: Gastroenterology

## 2014-10-19 ENCOUNTER — Ambulatory Visit (INDEPENDENT_AMBULATORY_CARE_PROVIDER_SITE_OTHER): Payer: 59 | Admitting: Gastroenterology

## 2014-10-19 VITALS — BP 131/93 | HR 97 | Temp 97.5°F | Ht 70.0 in | Wt 220.0 lb

## 2014-10-19 DIAGNOSIS — R7989 Other specified abnormal findings of blood chemistry: Secondary | ICD-10-CM

## 2014-10-19 DIAGNOSIS — F1010 Alcohol abuse, uncomplicated: Secondary | ICD-10-CM

## 2014-10-19 DIAGNOSIS — R079 Chest pain, unspecified: Secondary | ICD-10-CM | POA: Diagnosis not present

## 2014-10-19 DIAGNOSIS — K921 Melena: Secondary | ICD-10-CM | POA: Diagnosis not present

## 2014-10-19 DIAGNOSIS — Z1211 Encounter for screening for malignant neoplasm of colon: Secondary | ICD-10-CM | POA: Diagnosis not present

## 2014-10-19 DIAGNOSIS — R945 Abnormal results of liver function studies: Secondary | ICD-10-CM

## 2014-10-19 NOTE — Progress Notes (Signed)
Primary Care Physician:  Stephens Shire, MD  Primary Gastroenterologist:  Barney Drain, MD   Chief Complaint  Patient presents with  . OTHER    To reschedule EGD    HPI:  Cody [name] is a 49 y.o. male here to reschedule EGD. Seen in 08/2014 when he presented to ER with chest pain and syncope. He consume etoh heavily. During hospitalization he reported esophageal burning, melena, odynophagia. Denies dysphagia. Quit etoh for 2 months after hospitalization but recently started back. Denies further melena. No abdominal pain. Denies frequent ibuprofen. Overall improved since hospitalization.   No prior colonoscopy.    Current Outpatient Prescriptions  Medication Sig Dispense Refill  . ibuprofen (ADVIL,MOTRIN) 200 MG tablet Take 400 mg by mouth daily as needed for moderate pain.     No current facility-administered medications for this visit.    Allergies as of 10/19/2014  . (No Known Allergies)    Past Medical History  Diagnosis Date  . Arthritis   . COPD (chronic obstructive pulmonary disease)   . Depression   . Chronic chest pain   . Hypertension   . Alcohol abuse 08/16/2014  . Tobacco abuse 08/16/2014    Past Surgical History  Procedure Laterality Date  . Kidney tube surgery as a child       Family History  Problem Relation Age of Onset  . Cancer Other   . Diabetes Other   . COPD Father      Social History   Social History  . Marital Status: Single    Spouse Name: N/A  . Number of Children: 0  . Years of Education: N/A   Occupational History  . Dealer    Social History Main Topics  . Smoking status: Current Every Day Smoker -- 1.00 packs/day for 35 years    Types: Cigarettes  . Smokeless tobacco: Never Used     Comment: one pack daily  . Alcohol Use: No     Comment: 1 pint of liquor at night; pt currently trying to stop drinking  . Drug Use: No  . Sexual Activity: Not on file   Other Topics Concern  . Not on file   Social History Narrative       ROS:  General: Negative for anorexia, weight loss, fever, chills, fatigue, weakness. Eyes: Negative for vision changes.  ENT: Negative for hoarseness, difficulty swallowing , nasal congestion. CV: Negative for chest pain, angina, palpitations, dyspnea on exertion, peripheral edema.  Respiratory: Negative for dyspnea at rest, dyspnea on exertion, cough, sputum, wheezing.  GI: See history of present illness. GU:  Negative for dysuria, hematuria, urinary incontinence, urinary frequency, nocturnal urination.  MS: Negative for joint pain, low back pain.  Derm: Negative for rash or itching.  Neuro: Negative for weakness, abnormal sensation, seizure, frequent headaches, memory loss, confusion.  Psych: Negative for anxiety, depression, suicidal ideation, hallucinations.  Endo: Negative for unusual weight change.  Heme: Negative for bruising or bleeding. Allergy: Negative for rash or hives.    Physical Examination:  BP 131/93 mmHg  Pulse 97  Temp(Src) 97.5 F (36.4 C) (Oral)  Ht 5\' 10"  (1.778 m)  Wt 220 lb (99.791 kg)  BMI 31.57 kg/m2   General: Well-nourished, well-developed in no acute distress.  Head: Normocephalic, atraumatic.   Eyes: Conjunctiva pink, no icterus. Mouth: Oropharyngeal mucosa moist and pink , no lesions erythema or exudate. Neck: Supple without thyromegaly, masses, or lymphadenopathy.  Lungs: Clear to auscultation bilaterally.  Heart: Regular rate and rhythm, no murmurs  rubs or gallops.  Abdomen: Bowel sounds are normal, nontender, nondistended, no hepatosplenomegaly or masses, no abdominal bruits or    hernia , no rebound or guarding.   Rectal: not performed Extremities: No lower extremity edema. No clubbing or deformities.  Neuro: Alert and oriented x 4 , grossly normal neurologically.  Skin: Warm and dry, no rash or jaundice.   Psych: Alert and cooperative, normal mood and affect.  Labs: Lab Results  Component Value Date   WBC 6.3 08/17/2014   HGB  13.8 08/17/2014   HCT 40.4 08/17/2014   MCV 118.5* 08/17/2014   PLT 282 08/17/2014   Lab Results  Component Value Date   CREATININE 1.06 08/20/2014   BUN 14 08/20/2014   NA 135 08/20/2014   K 4.2 08/20/2014   CL 105 08/20/2014   CO2 23 08/20/2014   Lab Results  Component Value Date   ALT 69* 08/17/2014   AST 49* 08/17/2014   ALKPHOS 45 08/17/2014   BILITOT 0.7 08/17/2014   Lab Results  Component Value Date   LIPASE 58* 08/17/2014   No results found for: FOLATE Lab Results  Component Value Date   VITAMINB12 443 08/16/2014     Imaging Studies: No results found.

## 2014-10-19 NOTE — Patient Instructions (Addendum)
1. Upper endoscopy and colonoscopy with Dr. Oneida Alar as scheduled. See separate instructions.  2. You need an ultrasound of your liver to evaluate for damage related to alcohol abuse and abnormal liver blood work. We can schedule now or whenever you prefer in the next couple of months.  3. Please continue to try to stop drinking. Consider AA meetings. If you need assistant, let us know.

## 2014-10-25 ENCOUNTER — Encounter: Payer: Self-pay | Admitting: Gastroenterology

## 2014-10-25 ENCOUNTER — Ambulatory Visit (HOSPITAL_COMMUNITY)
Admission: RE | Admit: 2014-10-25 | Discharge: 2014-10-25 | Disposition: A | Discharge status: Home / Self Care | Payer: 59 | Source: Ambulatory Visit | Attending: Gastroenterology | Admitting: Gastroenterology

## 2014-10-25 DIAGNOSIS — R945 Abnormal results of liver function studies: Secondary | ICD-10-CM

## 2014-10-25 DIAGNOSIS — K8020 Calculus of gallbladder without cholecystitis without obstruction: Secondary | ICD-10-CM | POA: Insufficient documentation

## 2014-10-25 DIAGNOSIS — R7989 Other specified abnormal findings of blood chemistry: Secondary | ICD-10-CM

## 2014-10-25 NOTE — Assessment & Plan Note (Signed)
Hospitalization for atypical chest pain thought to be UGI related associated with melena, etoh abuse. Ddx: esophagitis, gastritis, PUD. Recommend EGD with deep seadation in the OR.  I have discussed the risks, alternatives, benefits with regards to but not limited to the risk of reaction to medication, bleeding, infection, perforation and the patient is agreeable to proceed. Written consent to be obtained.

## 2014-10-25 NOTE — Assessment & Plan Note (Addendum)
Elevated AST/ALT in setting of etoh abuse. Recommend abdominal u/s for evaluation. Encouraged etoh cessation. F/u preop labs. May require further work up if persistent elevated.

## 2014-10-25 NOTE — Assessment & Plan Note (Signed)
First ever screening colonoscopy with deep sedation given h/o etoh abuse.  I have discussed the risks, alternatives, benefits with regards to but not limited to the risk of reaction to medication, bleeding, infection, perforation and the patient is agreeable to proceed. Written consent to be obtained.

## 2014-10-26 ENCOUNTER — Telehealth: Payer: Self-pay | Admitting: Gastroenterology

## 2014-10-26 NOTE — Telephone Encounter (Signed)
Routing to Neil Crouch, PA for [employer]. Pt is aware we will call when the [place] has been signed off on.

## 2014-10-26 NOTE — Telephone Encounter (Signed)
Patient called this morning saying he had his U/S done yesterday and was checking to see if his results were ready. I explained it could take up to 7-10 business days and the nurse would call him once they are available. [phone]

## 2014-10-26 NOTE — Progress Notes (Signed)
cc'ed to pcp °

## 2014-11-13 NOTE — Progress Notes (Signed)
Quick Note:  Please let patient know he has gallstones and fatty liver.   Instructions for fatty liver: Recommend 1-2# weight loss per week until ideal body weight through exercise & diet. Low fat/cholesterol diet.  Avoid sweets, sodas, fruit juices, sweetened beverages like tea, etc. Gradually increase exercise from 15 min daily up to 1 hr per day 5 days/week. Limit alcohol use.  HE WAS SUPPOSED TO HAVE EGD/TCS IN OR AFTER LAST OV AND I DON'T SEE WHERE THIS WAS EVER SCHEDULED. PLEASE FOLLOW UP ON THIS. THANKS. ______

## 2014-11-14 NOTE — Progress Notes (Signed)
Quick Note:  Pt returned call and was informed of the results. He said he was never scheduled for the procedures.  Per [place], he did not return their call. He said he was supposed to find out when his girlfriend could bring him. He will schedule now.  Does he need an OV prior or OK to schedule! ______

## 2014-11-14 NOTE — Progress Notes (Signed)
Quick Note:  LMOM to call. ______ 

## 2014-12-12 ENCOUNTER — Telehealth: Payer: Self-pay

## 2014-12-12 NOTE — Progress Notes (Signed)
Quick Note:  Per Magda Paganini, Dr. Oneida Alar said I could triage this pt for EGD/TCS in OR for etoh abuse. ______

## 2014-12-12 NOTE — Progress Notes (Signed)
REVIEWED-NO ADDITIONAL RECOMMENDATIONS. 

## 2014-12-13 NOTE — Progress Notes (Signed)
Quick Note:  Yes, [facility] said we could triage. ______

## 2014-12-14 NOTE — Telephone Encounter (Signed)
Per Neil Crouch, PA, schedule the TCS for screening and EGD for melena and atypical chest apin, odynophagial In OR secondary to etoh abuse.

## 2014-12-26 NOTE — Telephone Encounter (Signed)
Gastroenterology Pre-Procedure Review  Request Date: 12/12/2014  Requesting Physician: Neil Crouch, PA and Dr. Oneida Alar  PATIENT REVIEW QUESTIONS: The patient responded to the following health history questions as indicated:    1. Diabetes Melitis: no 2. Joint replacements in the past 12 months: no 3. Major health problems in the past 3 months: no 4. Has an artificial valve or MVP: no 5. Has a defibrillator: no 6. Has been advised in past to take antibiotics in advance of a procedure like teeth cleaning: no 7. Family history of colon cancer: no  8. Alcohol Use: YES  Every week 9. History of sleep apnea: no     MEDICATIONS & ALLERGIES:    Patient reports the following regarding taking any blood thinners:   Plavix? no Aspirin? no Coumadin? no  Patient confirms/reports the following medications:  Current Outpatient Prescriptions  Medication Sig Dispense Refill  . amLODipine (NORVASC) 10 MG tablet Take 10 mg by mouth daily.    Marland Kitchen ibuprofen (ADVIL,MOTRIN) 200 MG tablet Take 400 mg by mouth daily as needed for moderate pain.    Marland Kitchen lisinopril-hydrochlorothiazide (PRINZIDE,ZESTORETIC) 10-12.5 MG tablet Take 1 tablet by mouth daily.     No current facility-administered medications for this visit.    Patient confirms/reports the following allergies:  No Known Allergies  No orders of the defined types were placed in this encounter.    AUTHORIZATION INFORMATION Primary Insurance:   ID #:   Group #:  Pre-Cert / Auth required:  Pre-Cert / Auth #:   Secondary Insurance:   ID #: Group #:  Pre-Cert / Auth required: Pre-Cert / Auth #:   SCHEDULE INFORMATION: Procedure has been scheduled as follows:  Date:                      Time:   Location:   This Gastroenterology Pre-Precedure Review Form is being routed to the following provider(s): Barney Drain, MD

## 2014-12-26 NOTE — Telephone Encounter (Signed)
SUPREP SPLIT DOSING- full  LIQUIDS WITH BREAKFAST.  Full Liquid Diet A high-calorie, high-protein supplement should be used to meet your nutritional requirements when the full liquid diet is continued for more than 2 or 3 days. If this diet is to be used for an extended period of time (more than 7 days), a multivitamin should be considered.  Breads and Starches  Allowed: None are allowed except crackersWHOLE OR pureed (made into a thick, smooth soup) in soup.   Avoid: Any others.    Potatoes/Pasta/Rice  Allowed: ANY ITEM AS A SOUP OR SMALL PLATE OF MASHED POTATOES OR SCRAMBLED EGGS.       Vegetables  Allowed: Strained tomato or vegetable juice. Vegetables pureed in soup.   Avoid: Any others.    Fruit  Allowed: Any strained fruit juices and fruit drinks. Include 1 serving of citrus or vitamin C-enriched fruit juice daily.   Avoid: Any others.  Meat and Meat Substitutes  Allowed: Egg  Avoid: Any meat, fish, or fowl. All cheese.  Milk  Allowed: SOY Milk beverages, including milk shakes and instant breakfast mixes. Smooth yogurt.   Avoid: Any others. Avoid dairy products if not tolerated.    Soups and Combination Foods  Allowed: Broth, strained cream soups. Strained, broth-based soups.   Avoid: Any others.    Desserts and Sweets  Allowed: flavored gelatin, tapioca, ice cream, sherbet, smooth pudding, junket, fruit ices, frozen ice pops, pudding pops, frozen fudge pops, chocolate syrup. Sugar, honey, jelly, syrup.   Avoid: Any others.  Fats and Oils  Allowed: Margarine, butter, cream, sour cream, oils.   Avoid: Any others.  Beverages  Allowed: All.   Avoid: None.  Condiments  Allowed: Iodized salt, pepper, spices, flavorings. Cocoa powder.   Avoid: Any others.    SAMPLE MEAL PLAN Breakfast   cup orange juice.   1 OR 2 EGGS  1 cup milk.   1 cup beverage (coffee or tea).   Cream or sugar, if desired.    Midmorning Snack  2 SCRAMBLED OR  HARD BOILED EGG   Lunch  1 cup cream soup.    cup fruit juice.   1 cup milk.    cup custard.   1 cup beverage (coffee or tea).   Cream or sugar, if desired.    Midafternoon Snack  1 cup milk shake.  Dinner  1 cup cream soup.    cup fruit juice.   1 cup MILK    cup pudding.   1 cup beverage (coffee or tea).   Cream or sugar, if desired.  Evening Snack  1 cup supplement.  To increase calories, add sugar, cream, butter, or margarine if possible. Nutritional supplements will also increase the total calories.

## 2015-01-02 NOTE — Telephone Encounter (Signed)
Please disregard the letter of instructions. Pt's needs date and time.

## 2015-01-04 ENCOUNTER — Ambulatory Visit: Payer: 59 | Admitting: Adult Health

## 2015-01-07 ENCOUNTER — Ambulatory Visit: Payer: 59 | Admitting: Adult Health

## 2015-01-08 ENCOUNTER — Telehealth: Payer: Self-pay

## 2015-01-08 ENCOUNTER — Other Ambulatory Visit: Payer: Self-pay

## 2015-01-08 NOTE — Telephone Encounter (Signed)
Pt is now scheduled for 01/29/2015 at 12:45 pm.

## 2015-01-08 NOTE — Telephone Encounter (Signed)
I called [employer] at [phone] and spoke to Amy H who said if pt has a referral on line previously for provider Barney Drain, MD then if he has not had 6 visits, another referral online is not required.  She said pt is insured but needs to pay premium before 02/06/2015 as he is in a grace period at this time.  Referral number on file is [id number] to cover 6 visits from 08/24/2014- 02/23/2015.  Reference number for this call is [id number].

## 2015-01-08 NOTE — Telephone Encounter (Signed)
REVIEWED-NO ADDITIONAL RECOMMENDATIONS. 

## 2015-01-08 NOTE — Telephone Encounter (Signed)
Rx sent to the pharmacy and instructions mailed to pt.  

## 2015-01-08 NOTE — Telephone Encounter (Signed)
I called pt to get on the schedule for his TCS and EGD. He is scheduled for 01/29/2015 tentatively, and I will let him know the time later.  I informed him that Amy said he needs to make a premium payment by the end of the month. He said he had trouble with them last time and it is set up on automatic draft, so he will check it out.

## 2015-01-09 MED ORDER — NA SULFATE-K SULFATE-MG SULF 17.5-3.13-1.6 GM/177ML PO SOLN: 1.0000 | ORAL | Status: DC

## 2015-01-09 NOTE — Telephone Encounter (Signed)
LATE ENTRY:  I called [employer] @ [phone] on 01/08/2015 in the afternoon and spoke with [facility] P who said a PA is required for the EGD and the TCS.  She started the process and the Pending Number is [id number].

## 2015-01-09 NOTE — Telephone Encounter (Signed)
A courtesy phone call from [place] at [facility] [phone]). She was asking if Dr. Oneida Alar has privileges at an ambulatory facility and I told her NO. Out doctors only do the procedures at [facility] as out patient. She said that is no problem, and will be fine. They are are doing more courtesy calls and encouraging ambulatory facilites. However, it is no problem. She said I would probably get an answer today on the PA.

## 2015-01-21 NOTE — Patient Instructions (Signed)
Cody [name]  01/21/2015     @PREFPERIOPPHARMACY @   Your procedure is scheduled on 01/29/2015.  Report to [facility] at 11:15 A.M.  Call this number if you have problems the morning of surgery:  [phone]   Remember:  Do not eat food or drink liquids after midnight.  Take these medicines the morning of surgery with A SIP OF WATER Amlodipine   Do not wear jewelry, make-up or nail polish.  Do not wear lotions, powders, or perfumes.  You may wear deodorant.  Do not shave 48 hours prior to surgery.  Men may shave face and neck.  Do not bring valuables to the hospital.  [facility] is not responsible for any belongings or valuables.  Contacts, dentures or bridgework may not be worn into surgery.  Leave your suitcase in the car.  After surgery it may be brought to your room.  For patients admitted to the hospital, discharge time will be determined by your treatment team.  Patients discharged the day of surgery will not be allowed to drive home.    Please read over the following fact sheets that you were given. Anesthesia Post-op Instructions     PATIENT INSTRUCTIONS POST-ANESTHESIA  IMMEDIATELY FOLLOWING SURGERY:  Do not drive or operate machinery for the first twenty four hours after surgery.  Do not make any important decisions for twenty four hours after surgery or while taking narcotic pain medications or sedatives.  If you develop intractable nausea and vomiting or a severe headache please notify your doctor immediately.  FOLLOW-UP:  Please make an appointment with your surgeon as instructed. You do not need to follow up with anesthesia unless specifically instructed to do so.  WOUND CARE INSTRUCTIONS (if applicable):  Keep a dry clean dressing on the anesthesia/puncture wound site if there is drainage.  Once the wound has quit draining you may leave it open to air.  Generally you should leave the bandage intact for twenty four hours unless there is drainage.  If the  epidural site drains for more than 36-48 hours please call the anesthesia department.  QUESTIONS?:  Please feel free to call your physician or the hospital operator if you have any questions, and they will be happy to assist you.      Esophagogastroduodenoscopy Esophagogastroduodenoscopy (EGD) is a procedure that is used to examine the lining of the esophagus, stomach, and first part of the small intestine (duodenum). A long, flexible, lighted tube with a camera attached (endoscope) is inserted down the throat to view these organs. This procedure is done to detect problems or abnormalities, such as inflammation, bleeding, ulcers, or growths, in order to treat them. The procedure lasts 5-20 minutes. It is usually an outpatient procedure, but it may need to be performed in a hospital in emergency cases. LET [facility] CARE PROVIDER KNOW ABOUT:  Any allergies you have.  All medicines you are taking, including vitamins, herbs, eye drops, creams, and over-the-counter medicines.  Previous problems you or members of your family have had with the use of anesthetics.  Any blood disorders you have.  Previous surgeries you have had.  Medical conditions you have. RISKS AND COMPLICATIONS Generally, this is a safe procedure. However, problems can occur and include:  Infection.  Bleeding.  Tearing (perforation) of the esophagus, stomach, or duodenum.  Difficulty breathing or not being able to breathe.  Excessive sweating.  Spasms of the larynx.  Slowed heartbeat.  Low blood pressure. BEFORE THE PROCEDURE  Do not  eat or drink anything after midnight on the night before the procedure or as directed by your health care provider.  Do not take your regular medicines before the procedure if your health care provider asks you not to. Ask your health care provider about changing or stopping those medicines.  If you wear dentures, be prepared to remove them before the procedure.  Arrange for  someone to drive you home after the procedure. PROCEDURE  A numbing medicine (local anesthetic) may be sprayed in your throat for comfort and to stop you from gagging or coughing.  You will have an IV tube inserted in a vein in your hand or arm. You will receive medicines and fluids through this tube.  You will be given a medicine to relax you (sedative).  A pain reliever will be given through the IV tube.  A mouth guard may be placed in your mouth to protect your teeth and to keep you from biting on the endoscope.  You will be asked to lie on your left side.  The endoscope will be inserted down your throat and into your esophagus, stomach, and duodenum.  Air will be put through the endoscope to allow your health care provider to clearly view the lining of your esophagus.  The lining of your esophagus, stomach, and duodenum will be examined. During the exam, your health care provider may:  Remove tissue to be examined under a microscope (biopsy) for inflammation, infection, or other medical problems.  Remove growths.  Remove objects (foreign bodies) that are stuck.  Treat any bleeding with medicines or other devices that stop tissues from bleeding (hot cautery, clipping devices).  Widen (dilate) or stretch narrowed areas of your esophagus and stomach.  The endoscope will be withdrawn. AFTER THE PROCEDURE  You will be taken to a recovery area for observation. Your blood pressure, heart rate, breathing rate, and blood oxygen level will be monitored often until the medicines you were given have worn off.  Do not eat or drink anything until the numbing medicine has worn off and your gag reflex has returned. You may choke.  Your health care provider should be able to discuss his or her findings with you. It will take longer to discuss the test results if any biopsies were taken.   This information is not intended to replace advice given to you by your health care provider. Make  sure you discuss any questions you have with your health care provider.   Document Released: 06/26/2004 Document Revised: 03/16/2014 Document Reviewed: 01/27/2012 Elsevier Interactive Patient Education 2016 [employer]. Colonoscopy A colonoscopy is an exam to look at the entire large intestine (colon). This exam can help find problems such as tumors, polyps, inflammation, and areas of bleeding. The exam takes about 1 hour.  LET [facility] CARE PROVIDER KNOW ABOUT:   Any allergies you have.  All medicines you are taking, including vitamins, herbs, eye drops, creams, and over-the-counter medicines.  Previous problems you or members of your family have had with the use of anesthetics.  Any blood disorders you have.  Previous surgeries you have had.  Medical conditions you have. RISKS AND COMPLICATIONS  Generally, this is a safe procedure. However, as with any procedure, complications can occur. Possible complications include:  Bleeding.  Tearing or rupture of the colon wall.  Reaction to medicines given during the exam.  Infection (rare). BEFORE THE PROCEDURE   Ask your health care provider about changing or stopping your regular medicines.  You  may be prescribed an oral bowel prep. This involves drinking a large amount of medicated liquid, starting the day before your procedure. The liquid will cause you to have multiple loose stools until your stool is almost clear or light green. This cleans out your colon in preparation for the procedure.  Do not eat or drink anything else once you have started the bowel prep, unless your health care provider tells you it is safe to do so.  Arrange for someone to drive you home after the procedure. PROCEDURE   You will be given medicine to help you relax (sedative).  You will lie on your side with your knees bent.  A long, flexible tube with a light and camera on the end (colonoscope) will be inserted through the rectum and into the  colon. The camera sends video back to a computer screen as it moves through the colon. The colonoscope also releases carbon dioxide gas to inflate the colon. This helps your health care provider see the area better.  During the exam, your health care provider may take a small tissue sample (biopsy) to be examined under a microscope if any abnormalities are found.  The exam is finished when the entire colon has been viewed. AFTER THE PROCEDURE   Do not drive for 24 hours after the exam.  You may have a small amount of blood in your stool.  You may pass moderate amounts of gas and have mild abdominal cramping or bloating. This is caused by the gas used to inflate your colon during the exam.  Ask when your test results will be ready and how you will get your results. Make sure you get your test results.   This information is not intended to replace advice given to you by your health care provider. Make sure you discuss any questions you have with your health care provider.   Document Released: 02/21/2000 Document Revised: 12/14/2012 Document Reviewed: 10/31/2012 Elsevier Interactive Patient Education [employer].

## 2015-01-22 ENCOUNTER — Encounter (HOSPITAL_COMMUNITY)
Admission: RE | Admit: 2015-01-22 | Discharge: 2015-01-22 | Disposition: A | Discharge status: Home / Self Care | Payer: 59 | Source: Ambulatory Visit | Attending: Gastroenterology | Admitting: Gastroenterology

## 2015-01-22 ENCOUNTER — Encounter (HOSPITAL_COMMUNITY): Payer: Self-pay

## 2015-01-22 DIAGNOSIS — Z01818 Encounter for other preprocedural examination: Secondary | ICD-10-CM | POA: Diagnosis present

## 2015-01-22 HISTORY — DX: Anemia, unspecified: D64.9

## 2015-01-22 LAB — CBC
HEMATOCRIT: 48 % (ref 39.0–52.0)
Hemoglobin: 16.6 g/dL (ref 13.0–17.0)
MCH: 40.7 pg — ABNORMAL HIGH (ref 26.0–34.0)
MCHC: 34.6 g/dL (ref 30.0–36.0)
MCV: 117.6 fL — AB (ref 78.0–100.0)
PLATELETS: 245 10*3/uL (ref 150–400)
RBC: 4.08 MIL/uL — AB (ref 4.22–5.81)
RDW: 14.7 % (ref 11.5–15.5)
WBC: 8.5 10*3/uL (ref 4.0–10.5)

## 2015-01-22 LAB — BASIC METABOLIC PANEL
ANION GAP: 12 (ref 5–15)
BUN: 16 mg/dL (ref 6–20)
CALCIUM: 9.7 mg/dL (ref 8.9–10.3)
CO2: 26 mmol/L (ref 22–32)
Chloride: 100 mmol/L — ABNORMAL LOW (ref 101–111)
Creatinine, Ser: 0.86 mg/dL (ref 0.61–1.24)
GLUCOSE: 110 mg/dL — AB (ref 65–99)
POTASSIUM: 4.6 mmol/L (ref 3.5–5.1)
Sodium: 138 mmol/L (ref 135–145)

## 2015-01-22 NOTE — Pre-Procedure Instructions (Signed)
Patient given information to sign up for my chart at home. 

## 2015-01-23 ENCOUNTER — Telehealth: Payer: Self-pay

## 2015-01-23 NOTE — Telephone Encounter (Signed)
I called [employer] and spoke to [place] @ [phone] and the EGD and TCS have been approved to be done at [facility] as outpatient. Reference number is [id number]. She is also faxing me the approval.

## 2015-01-23 NOTE — Telephone Encounter (Signed)
REVIEWED-NO ADDITIONAL RECOMMENDATIONS. 

## 2015-01-23 NOTE — Telephone Encounter (Signed)
I called pt to update meds prior to procedures on 01/29/2015. He said there has been no change in his meds.

## 2015-01-29 ENCOUNTER — Encounter (HOSPITAL_COMMUNITY)
Admission: RE | Disposition: A | Discharge status: Home / Self Care | Payer: Self-pay | Source: Ambulatory Visit | Attending: Gastroenterology

## 2015-01-29 ENCOUNTER — Ambulatory Visit (HOSPITAL_COMMUNITY)
Admission: RE | Admit: 2015-01-29 | Discharge: 2015-01-29 | Disposition: A | Discharge status: Home / Self Care | Payer: 59 | Source: Ambulatory Visit | Attending: Gastroenterology | Admitting: Gastroenterology

## 2015-01-29 ENCOUNTER — Ambulatory Visit (HOSPITAL_COMMUNITY): Payer: 59 | Admitting: Anesthesiology

## 2015-01-29 ENCOUNTER — Encounter (HOSPITAL_COMMUNITY): Payer: Self-pay | Admitting: *Deleted

## 2015-01-29 DIAGNOSIS — J449 Chronic obstructive pulmonary disease, unspecified: Secondary | ICD-10-CM | POA: Diagnosis not present

## 2015-01-29 DIAGNOSIS — K317 Polyp of stomach and duodenum: Secondary | ICD-10-CM | POA: Insufficient documentation

## 2015-01-29 DIAGNOSIS — F17200 Nicotine dependence, unspecified, uncomplicated: Secondary | ICD-10-CM | POA: Diagnosis not present

## 2015-01-29 DIAGNOSIS — G8929 Other chronic pain: Secondary | ICD-10-CM | POA: Insufficient documentation

## 2015-01-29 DIAGNOSIS — Z79899 Other long term (current) drug therapy: Secondary | ICD-10-CM | POA: Insufficient documentation

## 2015-01-29 DIAGNOSIS — K648 Other hemorrhoids: Secondary | ICD-10-CM | POA: Diagnosis not present

## 2015-01-29 DIAGNOSIS — K921 Melena: Secondary | ICD-10-CM

## 2015-01-29 DIAGNOSIS — D128 Benign neoplasm of rectum: Secondary | ICD-10-CM | POA: Diagnosis not present

## 2015-01-29 DIAGNOSIS — K319 Disease of stomach and duodenum, unspecified: Secondary | ICD-10-CM | POA: Insufficient documentation

## 2015-01-29 DIAGNOSIS — K2970 Gastritis, unspecified, without bleeding: Secondary | ICD-10-CM | POA: Diagnosis not present

## 2015-01-29 DIAGNOSIS — K621 Rectal polyp: Secondary | ICD-10-CM | POA: Insufficient documentation

## 2015-01-29 DIAGNOSIS — K5730 Diverticulosis of large intestine without perforation or abscess without bleeding: Secondary | ICD-10-CM | POA: Diagnosis not present

## 2015-01-29 DIAGNOSIS — K2900 Acute gastritis without bleeding: Secondary | ICD-10-CM | POA: Insufficient documentation

## 2015-01-29 DIAGNOSIS — D649 Anemia, unspecified: Secondary | ICD-10-CM | POA: Insufficient documentation

## 2015-01-29 DIAGNOSIS — K2980 Duodenitis without bleeding: Secondary | ICD-10-CM | POA: Diagnosis not present

## 2015-01-29 DIAGNOSIS — M1990 Unspecified osteoarthritis, unspecified site: Secondary | ICD-10-CM | POA: Diagnosis not present

## 2015-01-29 DIAGNOSIS — Q438 Other specified congenital malformations of intestine: Secondary | ICD-10-CM | POA: Diagnosis not present

## 2015-01-29 DIAGNOSIS — K259 Gastric ulcer, unspecified as acute or chronic, without hemorrhage or perforation: Secondary | ICD-10-CM | POA: Insufficient documentation

## 2015-01-29 DIAGNOSIS — Z1211 Encounter for screening for malignant neoplasm of colon: Secondary | ICD-10-CM

## 2015-01-29 DIAGNOSIS — I10 Essential (primary) hypertension: Secondary | ICD-10-CM | POA: Insufficient documentation

## 2015-01-29 DIAGNOSIS — F329 Major depressive disorder, single episode, unspecified: Secondary | ICD-10-CM | POA: Insufficient documentation

## 2015-01-29 DIAGNOSIS — R079 Chest pain, unspecified: Secondary | ICD-10-CM | POA: Insufficient documentation

## 2015-01-29 HISTORY — PX: ESOPHAGOGASTRODUODENOSCOPY (EGD) WITH PROPOFOL: SHX5813

## 2015-01-29 HISTORY — PX: BIOPSY: SHX5522

## 2015-01-29 HISTORY — PX: POLYPECTOMY: SHX5525

## 2015-01-29 HISTORY — PX: COLONOSCOPY WITH PROPOFOL: SHX5780

## 2015-01-29 SURGERY — COLONOSCOPY WITH PROPOFOL
Anesthesia: Monitor Anesthesia Care | Site: Esophagus

## 2015-01-29 MED ORDER — ONDANSETRON HCL 4 MG/2ML IJ SOLN
INTRAMUSCULAR | Status: AC
  Filled 2015-01-29: qty 2

## 2015-01-29 MED ORDER — MIDAZOLAM HCL 2 MG/2ML IJ SOLN
INTRAMUSCULAR | Status: AC
  Filled 2015-01-29: qty 2

## 2015-01-29 MED ORDER — OMEPRAZOLE 20 MG PO CPDR: DELAYED_RELEASE_CAPSULE | ORAL | Status: DC

## 2015-01-29 MED ORDER — GLYCOPYRROLATE 0.2 MG/ML IJ SOLN
0.2000 mg | Freq: Once | INTRAMUSCULAR | Status: AC
  Administered 2015-01-29: 0.2 mg via INTRAVENOUS

## 2015-01-29 MED ORDER — FENTANYL CITRATE (PF) 100 MCG/2ML IJ SOLN
25.0000 ug | INTRAMUSCULAR | Status: AC
  Administered 2015-01-29 (×2): 25 ug via INTRAVENOUS

## 2015-01-29 MED ORDER — FENTANYL CITRATE (PF) 100 MCG/2ML IJ SOLN
INTRAMUSCULAR | Status: AC
  Filled 2015-01-29: qty 2

## 2015-01-29 MED ORDER — LIDOCAINE VISCOUS 2 % MT SOLN
5.0000 mL | Freq: Two times a day (BID) | OROMUCOSAL | Status: DC
  Administered 2015-01-29 (×2): 5 mL via OROMUCOSAL

## 2015-01-29 MED ORDER — PROPOFOL 10 MG/ML IV BOLUS
INTRAVENOUS | Status: AC
  Filled 2015-01-29: qty 20

## 2015-01-29 MED ORDER — MIDAZOLAM HCL 5 MG/5ML IJ SOLN
INTRAMUSCULAR | Status: DC | PRN
  Administered 2015-01-29: 2 mg via INTRAVENOUS
  Administered 2015-01-29: 1 mg via INTRAVENOUS

## 2015-01-29 MED ORDER — ONDANSETRON HCL 4 MG/2ML IJ SOLN
4.0000 mg | Freq: Once | INTRAMUSCULAR | Status: AC
  Administered 2015-01-29: 4 mg via INTRAVENOUS

## 2015-01-29 MED ORDER — WATER FOR IRRIGATION, STERILE IR SOLN
Status: DC | PRN
  Administered 2015-01-29: 1000 mL via SURGICAL_CAVITY

## 2015-01-29 MED ORDER — FENTANYL CITRATE (PF) 100 MCG/2ML IJ SOLN: 25.0000 ug | INTRAMUSCULAR | Status: DC | PRN

## 2015-01-29 MED ORDER — GLYCOPYRROLATE 0.2 MG/ML IJ SOLN
INTRAMUSCULAR | Status: AC
  Filled 2015-01-29: qty 1

## 2015-01-29 MED ORDER — PROPOFOL 500 MG/50ML IV EMUL
INTRAVENOUS | Status: DC | PRN
  Administered 2015-01-29: 125 ug/kg/min via INTRAVENOUS
  Administered 2015-01-29 (×2): 70 ug/kg/min via INTRAVENOUS

## 2015-01-29 MED ORDER — ONDANSETRON HCL 4 MG/2ML IJ SOLN: 4.0000 mg | Freq: Once | INTRAMUSCULAR | Status: DC | PRN

## 2015-01-29 MED ORDER — LACTATED RINGERS IV SOLN
INTRAVENOUS | Status: DC
  Administered 2015-01-29: 1000 mL via INTRAVENOUS

## 2015-01-29 MED ORDER — MIDAZOLAM HCL 2 MG/2ML IJ SOLN
1.0000 mg | INTRAMUSCULAR | Status: DC | PRN
  Administered 2015-01-29 (×2): 2 mg via INTRAVENOUS
  Filled 2015-01-29: qty 2

## 2015-01-29 MED ORDER — LIDOCAINE VISCOUS 2 % MT SOLN
OROMUCOSAL | Status: AC
Start: 2015-01-29 — End: 2015-01-29
  Administered 2015-01-29: 5 mL via OROMUCOSAL
  Filled 2015-01-29: qty 15

## 2015-01-29 SURGICAL SUPPLY — 27 items
AMPLIFEYE LARGE BLUE (MISCELLANEOUS) ×3 IMPLANT
BLOCK BITE 60FR ADLT L/F BLUE (MISCELLANEOUS) ×1 IMPLANT
ELECT REM PT RETURN 9FT ADLT (ELECTROSURGICAL) ×3
ELECTRODE REM PT RTRN 9FT ADLT (ELECTROSURGICAL) IMPLANT
FCP BXJMBJMB 240X2.8X (CUTTING FORCEPS)
FLOOR PAD 36X40 (MISCELLANEOUS) ×3
FORCEPS BIOP RAD 4 LRG CAP 4 (CUTTING FORCEPS) ×1 IMPLANT
FORCEPS BIOP RJ4 240 W/NDL (CUTTING FORCEPS)
FORCEPS BXJMBJMB 240X2.8X (CUTTING FORCEPS) IMPLANT
FORMALIN 10 PREFIL 20ML (MISCELLANEOUS) ×2 IMPLANT
INJECTOR/SNARE I SNARE (MISCELLANEOUS) IMPLANT
KIT ENDO PROCEDURE PEN (KITS) ×3 IMPLANT
MANIFOLD NEPTUNE II (INSTRUMENTS) ×3 IMPLANT
NDL SCLEROTHERAPY 25GX240 (NEEDLE) IMPLANT
NEEDLE SCLEROTHERAPY 25GX240 (NEEDLE) IMPLANT
PAD FLOOR 36X40 (MISCELLANEOUS) ×2 IMPLANT
PROBE APC STR FIRE (PROBE) IMPLANT
PROBE INJECTION GOLD (MISCELLANEOUS)
PROBE INJECTION GOLD 7FR (MISCELLANEOUS) IMPLANT
SNARE ROTATE MED OVAL 20MM (MISCELLANEOUS) ×1 IMPLANT
SNARE SHORT THROW 13M SML OVAL (MISCELLANEOUS) ×2 IMPLANT
SYR 50ML LL SCALE MARK (SYRINGE) ×1 IMPLANT
SYR INFLATION 60ML (SYRINGE) IMPLANT
TRAP SPECIMEN MUCOUS 40CC (MISCELLANEOUS) ×1 IMPLANT
TUBING INSUFFLATOR CO2MPACT (TUBING) ×3 IMPLANT
TUBING IRRIGATION ENDOGATOR (MISCELLANEOUS) ×1 IMPLANT
WATER STERILE IRR 1000ML POUR (IV SOLUTION) ×1 IMPLANT

## 2015-01-29 NOTE — Anesthesia Preprocedure Evaluation (Signed)
Anesthesia Evaluation  Patient identified by MRN, date of birth, ID band Patient awake    Reviewed: Allergy & Precautions, NPO status , Patient's Chart, lab work & pertinent test results  Airway Mallampati: II  TM Distance: >3 FB     Dental  (+) Edentulous Upper, Edentulous Lower   Pulmonary COPD, Current Smoker,    breath sounds clear to auscultation       Cardiovascular hypertension, Pt. on medications  Rhythm:Regular Rate:Normal     Neuro/Psych PSYCHIATRIC DISORDERS Depression    GI/Hepatic (+)     substance abuse  alcohol use,   Endo/Other    Renal/GU      Musculoskeletal   Abdominal   Peds  Hematology  (+) anemia ,   Anesthesia Other Findings   Reproductive/Obstetrics                             Anesthesia Physical Anesthesia Plan  ASA: III  Anesthesia Plan: MAC   Post-op Pain Management:    Induction: Intravenous  Airway Management Planned: Simple Face Mask  Additional Equipment:   Intra-op Plan:   Post-operative Plan:   Informed Consent: I have reviewed the patients History and Physical, chart, labs and discussed the procedure including the risks, benefits and alternatives for the proposed anesthesia with the patient or authorized representative who has indicated his/her understanding and acceptance.     Plan Discussed with:   Anesthesia Plan Comments:         Anesthesia Quick Evaluation

## 2015-01-29 NOTE — Transfer of Care (Signed)
Immediate Anesthesia Transfer of Care Note  Patient: Cody [name]  Procedure(s) Performed: Procedure(s) with comments: COLONOSCOPY WITH PROPOFOL (N/A) - in cecum @ [id number], out @ 1407 ESOPHAGOGASTRODUODENOSCOPY (EGD) WITH PROPOFOL (N/A) POLYPECTOMY (N/A) BIOPSY (N/A)  Patient Location: PACU  Anesthesia Type:MAC  Level of Consciousness: awake, alert  and patient cooperative  Airway & Oxygen Therapy: Patient Spontanous Breathing, Patient connected to nasal cannula oxygen and Patient connected to T-piece oxygen  Post-op Assessment: Report given to RN, Post -op Vital signs reviewed and stable and Patient moving all extremities  Post vital signs: Reviewed and stable  Last Vitals:  Filed Vitals:   01/29/15 1315 01/29/15 1320  BP: 124/86 122/86  Pulse:    Temp:    Resp: 17 17    Complications: No apparent anesthesia complications

## 2015-01-29 NOTE — Anesthesia Postprocedure Evaluation (Signed)
Anesthesia Post Note  Patient: Cody [name]  Procedure(s) Performed: Procedure(s) (LRB): COLONOSCOPY WITH PROPOFOL (N/A) ESOPHAGOGASTRODUODENOSCOPY (EGD) WITH PROPOFOL (N/A) POLYPECTOMY (N/A) BIOPSY (N/A)  Patient location during evaluation: PACU Anesthesia Type: MAC Level of consciousness: awake and alert Pain management: pain level controlled Vital Signs Assessment: post-procedure vital signs reviewed and stable Respiratory status: spontaneous breathing and patient connected to face mask oxygen Cardiovascular status: stable Postop Assessment: No signs of nausea or vomiting Anesthetic complications: no    Last Vitals:  Filed Vitals:   01/29/15 1315 01/29/15 1320  BP: 124/86 122/86  Pulse:    Temp:    Resp: 17 17    Last Pain: There were no vitals filed for this visit.               [place] Wank J

## 2015-01-29 NOTE — Op Note (Signed)
[place], [address]   COLONOSCOPY PROCEDURE REPORT  PATIENT: Cody [name], Cody [name]  MR#: [id number] BIRTHDATE: [birth year] , 49  yrs. old GENDER: male ENDOSCOPIST: Danie Binder, MD REFERRED [id number] Tollie Pizza, M.D. PROCEDURE DATE:  2015/02/16 PROCEDURE:   Colonoscopy with snare polypectomy INDICATIONS:average risk patient for colon cancer. MEDICATIONS: Monitored anesthesia care  DESCRIPTION OF PROCEDURE:    Physical exam was performed.  Informed consent was obtained from the patient after explaining the benefits, risks, and alternatives to procedure.  The patient was connected to monitor and placed in left lateral position. Continuous oxygen was provided by nasal cannula and IV medicine administered through an indwelling cannula.  After administration of sedation and rectal exam, the patients rectum was intubated and the     colonoscope was advanced under direct visualization to the cecum.  The scope was removed slowly by carefully examining the color, texture, anatomy, and integrity mucosa on the way out.  The patient was recovered in endoscopy and discharged home in satisfactory condition. Estimated blood loss is zero unless otherwise noted in this procedure report.    COLON FINDINGS: The colon was redundant.  Manual abdominal counter-pressure was used to reach the cecum.  The patient was moved on to their back to reach the cecum, Two sessile polyps ranging from 5 to 5mm in size were found in the rectum.  A polypectomy was performed using snare cautery.  , and There was moderate diverticulosis noted in the sigmoid colon and descending colon with associated muscular hypertrophy and tortuosity.    LARGE INTERNAL HEMORRHOIDS.  PREP QUALITY: excellent  CECAL W/D TIME: 18       minutes COMPLICATIONS: None  ENDOSCOPIC IMPRESSION: 1.   The LEFT colon IS redundant 2.   Two RECTAL sessile polyps REMOVED 3.   Moderate diverticulosis  in the  sigmoid colon and descending colon  RECOMMENDATIONS: CONTINUE WEIGHT LOSS EFFORTS. DRINK WATER TO KEEP YOUR URINE LIGHT YELLOW. FOLLOW A HIGH FIBER/LOW FAT DIET. AVOID ITEMS THAT TRIGGER GASTRITIS. AWAIT BIOPSY RESULTS. FOLLOW UP IN 6 MOS. Next colonoscopy in 5-10 years WITH AN OVERTUBE.   _______________________________ eSignedDanie Binder, MD 02/16/2015 3:59 PM    CPT CODES: ICD CODES:  The ICD and CPT codes recommended by this software are interpretations from the data that the clinical staff has captured with the software.  The verification of the translation of this report to the ICD and CPT codes and modifiers is the sole responsibility of the health care institution and practicing physician where this report was generated.  [place] [employer]. will not be held responsible for the validity of the ICD and CPT codes included on this report.  AMA assumes no liability for data contained or not contained herein. CPT is a Designer, television/film set of the [employer].

## 2015-01-29 NOTE — Op Note (Signed)
[facility] [address] [place], [address]   ENDOSCOPY PROCEDURE REPORT  PATIENT: Cody, [name]  MR#: [id number] BIRTHDATE: [birth year] , 49  yrs. old GENDER: male  ENDOSCOPIST: Danie Binder, MD REFERRED [id number] Tollie Pizza, M.D.  PROCEDURE DATE: 02/19/2015 PROCEDURE:   EGD w/ biopsy  INDICATIONS:melena. MEDICATIONS: Monitored anesthesia care TOPICAL ANESTHETIC:   Viscous Xylocaine ASA CLASS:  DESCRIPTION OF PROCEDURE:     Physical exam was performed.  Informed consent was obtained from the patient after explaining the benefits, risks, and alternatives to the procedure.  The patient was connected to the monitor and placed in the left lateral position.  Continuous oxygen was provided by nasal cannula and IV medicine administered through an indwelling cannula.  After administration of sedation, the patients esophagus was intubated and the     endoscope was advanced under direct visualization to the second portion of the duodenum.  The scope was removed slowly by carefully examining the color, texture, anatomy, and integrity of the mucosa on the way out.  The patient was recovered in endoscopy and discharged home in satisfactory condition.  Estimated blood loss is zero unless otherwise noted in this procedure report.     ESOPHAGUS: The mucosa of the esophagus appeared normal.   STOMACH: Severe erosive gastritis (inflammation) was found in the gastric body and gastric antrum.  Multiple biopsies were performed using cold forceps.   DUODENUM: Moderate duodenal inflammation was found in the duodenal bulb.   The duodenal mucosa showed no abnormalities in the 2nd part of the duodenum. COMPLICATIONS: There were no immediate complications.  ENDOSCOPIC IMPRESSION: 1.   MELENA MOST LIKELY DUE TO PUD AND NOW ULCERS ARE EROSIVE GASTRITIS 2.   MODERTAE DUODENITIS  RECOMMENDATIONS: CONTINUE WEIGHT LOSS EFFORTS. DRINK WATER TO KEEP YOUR URINE LIGHT YELLOW. FOLLOW  A HIGH FIBER/LOW FAT DIET. AVOID ITEMS THAT TRIGGER GASTRITIS. AWAIT BIOPSY RESULTS. FOLLOW UP IN 6 MOS. Next colonoscopy in 5-10 years WITH AN OVERTUBE.  REPEAT EXAM:  eSigned:  Danie Binder, MD 2015-02-19 4:03 PMevised  CPT CODES: ICD CODES:  The ICD and CPT codes recommended by this software are interpretations from the data that the clinical staff has captured with the software.  The verification of the translation of this report to the ICD and CPT codes and modifiers is the sole responsibility of the health care institution and practicing physician where this report was generated.  [email or web address] will not be held responsible for the validity of the ICD and CPT codes included on this report.  AMA assumes no liability for data contained or not contained herein. CPT is a Designer, television/film set of the [employer].

## 2015-01-29 NOTE — H&P (Addendum)
  Primary Care Physician:  Stephens Shire, MD Primary Gastroenterologist:  Dr. Oneida Alar  Pre-Procedure History & Physical: HPI:  Cody [name] is a 49 y.o. male here for SCREENING FOR COLON CA AND melena JUN 2016-HB 13.8, NOW NOV 2016 16.6  Past Medical History  Diagnosis Date  . Arthritis   . COPD (chronic obstructive pulmonary disease) ([place])   . Depression   . Chronic chest pain   . Hypertension   . Alcohol abuse 08/16/2014  . Tobacco abuse 08/16/2014  . Anemia     Past Surgical History  Procedure Laterality Date  . Kidney tube surgery as a child      Prior to Admission medications   Medication Sig Start Date End Date Taking? Authorizing Provider  amLODipine (NORVASC) 10 MG tablet Take 10 mg by mouth daily.   Yes Historical Provider, MD  ibuprofen (ADVIL,MOTRIN) 200 MG tablet Take 400 mg by mouth daily as needed for moderate pain.   Yes Historical Provider, MD  lisinopril-hydrochlorothiazide (PRINZIDE,ZESTORETIC) 10-12.5 MG tablet Take 1 tablet by mouth daily.   Yes Historical Provider, MD  Na Sulfate-K Sulfate-Mg Sulf (SUPREP BOWEL PREP) SOLN Take 1 kit by mouth as directed. 01/09/15  Yes Danie Binder, MD    Allergies as of 01/08/2015  . (No Known Allergies)    Family History  Problem Relation Age of Onset  . Cancer Other     mother's side  . Diabetes Other   . COPD Father   . Breast cancer Mother   . Colon cancer Neg Hx   . Liver disease Neg Hx     Social History   Social History  . Marital Status: Single    Spouse Name: N/A  . Number of Children: 0  . Years of Education: N/A   Occupational History  . Dealer    Social History Main Topics  . Smoking status: Current Every Day Smoker -- 1.50 packs/day for 35 years    Types: Cigarettes  . Smokeless tobacco: Never Used     Comment: one pack daily  . Alcohol Use: 0.0 oz/week    0 Standard drinks or equivalent per week     Comment: 1 pint of liquor at night; pt currently trying to stop drinking  . Drug  Use: Yes    Special: Marijuana     Comment: last used 01/21/2015.  Marland Kitchen Sexual Activity: Yes    Birth Control/ Protection: None   Other Topics Concern  . Not on file   Social History Narrative    Review of Systems: See HPI, otherwise negative ROS   Physical Exam: BP 128/83 mmHg  Pulse 92  Temp(Src) 98.2 F (36.8 C) (Oral)  Resp 15  Ht $R'5\' 10"'DG$  (1.778 m)  Wt 237 lb (107.502 kg)  BMI 34.01 kg/m2  SpO2 97% General:   Alert,  pleasant and cooperative in NAD Head:  Normocephalic and atraumatic. Neck:  Supple; Lungs:  Clear throughout to auscultation.    Heart:  Regular rate and rhythm. Abdomen:  Soft, nontender and nondistended. Normal bowel sounds, without guarding, and without rebound.   Neurologic:  Alert and  oriented x4;  grossly normal neurologically.  Impression/Plan:     SCREENING FOR COLON CA AND MELENA  PLAN:  1.EGD/TCS TODAY

## 2015-01-29 NOTE — Discharge Instructions (Signed)
You had 2 polyps removed FROM YOUR RECTUM.  You have internal hemorrhoids & DIVERTICULOSIS IN YOUR LEFT COLON. YOU HAVE A FLOPPY LEFT COLON. You have severe gastritis, & a duodenitis.  THE BLACK STOOL YOU SAW WAS MOST LIKELY DUE TO STOMACH ULCERS WHICH ARE NOW PARTIALLY HEALED.I biopsied your stomach.    CONTINUE YOUR WEIGHT LOSS EFFORTS. LOSE 10 MORE LBS.  DRINK WATER TO KEEP YOUR URINE LIGHT YELLOW.  FOLLOW A HIGH FIBER/LOW FAT DIET. AVOID ITEMS THAT CAUSE BLOATING. SEE INFO BELOW.  AVOID ITEMS THAT TRIGGER GASTRITIS. SEE INFO BELOW.  YOUR BIOPSY RESULTS WILL BE AVAILABLE IN MY CHART THUR NOV 24 AND MY OFFICE WILL CONTACT YOU IN 10-14 DAYS WITH YOUR RESULTS.   FOLLOW UP IN 6 MOS.   Next colonoscopy in 5-10 years.    ENDOSCOPY Care After Read the instructions outlined below and refer to this sheet in the next week. These discharge instructions provide you with general information on caring for yourself after you leave the hospital. While your treatment has been planned according to the most current medical practices available, unavoidable complications occasionally occur. If you have any problems or questions after discharge, call DR. Mayme Profeta, [phone].  ACTIVITY  You may resume your regular activity, but move at a slower pace for the next 24 hours.   Take frequent rest periods for the next 24 hours.   Walking will help get rid of the air and reduce the bloated feeling in your belly (abdomen).   No driving for 24 hours (because of the medicine (anesthesia) used during the test).   You may shower.   Do not sign any important legal documents or operate any machinery for 24 hours (because of the anesthesia used during the test).    NUTRITION  Drink plenty of fluids.   You may resume your normal diet as instructed by your doctor.   Begin with a light meal and progress to your normal diet. Heavy or fried foods are harder to digest and may make you feel sick to your stomach  (nauseated).   Avoid alcoholic beverages for 24 hours or as instructed.    MEDICATIONS  You may resume your normal medications.   WHAT YOU CAN EXPECT TODAY  Some feelings of bloating in the abdomen.   Passage of more gas than usual.   Spotting of blood in your stool or on the toilet paper  .  IF YOU HAD POLYPS REMOVED DURING THE ENDOSCOPY:  Eat a soft diet IF YOU HAVE NAUSEA, BLOATING, ABDOMINAL PAIN, OR VOMITING.    FINDING OUT THE RESULTS OF YOUR TEST Not all test results are available during your visit. DR. Oneida Alar WILL CALL YOU WITHIN 14 DAYS OF YOUR PROCEDUE WITH YOUR RESULTS. Do not assume everything is normal if you have not heard from DR. Zen Cedillos, CALL HER OFFICE AT [phone].  SEEK IMMEDIATE MEDICAL ATTENTION AND CALL THE OFFICE: [phone] IF:  You have more than a spotting of blood in your stool.   Your belly is swollen (abdominal distention).   You are nauseated or vomiting.   You have a temperature over 101F.   You have abdominal pain or discomfort that is severe or gets worse throughout the day.   Gastritis/DUODENITIS  Gastritis/DUODENITIS is inflammation (the body's way of reacting to injury and/or infection) of the stomach/SMALLBOWEL. It is often caused by viral or bacterial (germ) infections. It can also be caused BY ASPIRIN, BC/GOODY POWDER'S, (IBUPROFEN) MOTRIN, OR ALEVE (NAPROXEN), chemicals (including alcohol), SPICY FOODS,  and medications. This illness may be associated with generalized malaise (feeling tired, not well), UPPER ABDOMINAL STOMACH cramps, and fever. One common bacterial cause of gastritis is an organism known as H. Pylori. This can be treated with antibiotics.    High-Fiber Diet A high-fiber diet changes your normal diet to include more whole grains, legumes, fruits, and vegetables. Changes in the diet involve replacing refined carbohydrates with unrefined foods. The calorie level of the diet is essentially unchanged. The Dietary  Reference Intake (recommended amount) for adult males is 38 grams per day. For adult females, it is 25 grams per day. Pregnant and lactating women should consume 28 grams of fiber per day. Fiber is the intact part of a plant that is not broken down during digestion. Functional fiber is fiber that has been isolated from the plant to provide a beneficial effect in the body. PURPOSE  Increase stool bulk.   Ease and regulate bowel movements.   Lower cholesterol.  INDICATIONS THAT YOU NEED MORE FIBER  Constipation and hemorrhoids.   Uncomplicated diverticulosis (intestine condition) and irritable bowel syndrome.   Weight management.   As a protective measure against hardening of the arteries (atherosclerosis), diabetes, and cancer.   GUIDELINES FOR INCREASING FIBER IN THE DIET  Start adding fiber to the diet slowly. A gradual increase of about 5 more grams (2 slices of whole-wheat bread, 2 servings of most fruits or vegetables, or 1 bowl of high-fiber cereal) per day is best. Too rapid an increase in fiber may result in constipation, flatulence, and bloating.   Drink enough water and fluids to keep your urine clear or pale yellow. Water, juice, or caffeine-free drinks are recommended. Not drinking enough fluid may cause constipation.   Eat a variety of high-fiber foods rather than one type of fiber.   Try to increase your intake of fiber through using high-fiber foods rather than fiber pills or supplements that contain small amounts of fiber.   The goal is to change the types of food eaten. Do not supplement your present diet with high-fiber foods, but replace foods in your present diet.  INCLUDE A VARIETY OF FIBER SOURCES  Replace refined and processed grains with whole grains, canned fruits with fresh fruits, and incorporate other fiber sources. White rice, white breads, and most bakery goods contain little or no fiber.   Brown whole-grain rice, buckwheat oats, and many fruits and  vegetables are all good sources of fiber. These include: broccoli, Brussels sprouts, cabbage, cauliflower, beets, sweet potatoes, white potatoes (skin on), carrots, tomatoes, eggplant, squash, berries, fresh fruits, and dried fruits.   Cereals appear to be the richest source of fiber. Cereal fiber is found in whole grains and bran. Bran is the fiber-rich outer coat of cereal grain, which is largely removed in refining. In whole-grain cereals, the bran remains. In breakfast cereals, the largest amount of fiber is found in those with "bran" in their names. The fiber content is sometimes indicated on the label.   You may need to include additional fruits and vegetables each day.   In baking, for 1 cup white flour, you may use the following substitutions:   1 cup whole-wheat flour minus 2 tablespoons.   1/2 cup white flour plus 1/2 cup whole-wheat flour.   Low-Fat Diet BREADS, CEREALS, PASTA, RICE, DRIED PEAS, AND BEANS These products are high in carbohydrates and most are low in fat. Therefore, they can be increased in the diet as substitutes for fatty foods. They too, however, contain  calories and should not be eaten in excess. Cereals can be eaten for snacks as well as for breakfast.  Include foods that contain fiber (fruits, vegetables, whole grains, and legumes). Research shows that fiber may lower blood cholesterol levels, especially the water-soluble fiber found in fruits, vegetables, oat products, and legumes. FRUITS AND VEGETABLES It is good to eat fruits and vegetables. Besides being sources of fiber, both are rich in vitamins and some minerals. They help you get the daily allowances of these nutrients. Fruits and vegetables can be used for snacks and desserts. MEATS Limit lean meat, chicken, [place], and fish to no more than 6 ounces per day. Beef, Pork, and Lamb Use lean cuts of beef, pork, and lamb. Lean cuts include:  Extra-lean ground beef.  Arm roast.  Sirloin tip.  Center-cut  ham.  Round steak.  Loin chops.  Rump roast.  Tenderloin.  Trim all fat off the outside of meats before cooking. It is not necessary to severely decrease the intake of red meat, but lean choices should be made. Lean meat is rich in protein and contains a highly absorbable form of iron. Premenopausal women, in particular, should avoid reducing lean red meat because this could increase the risk for low red blood cells (iron-deficiency anemia). The organ meats, such as liver, sweetbreads, kidneys, and brain are very rich in cholesterol. They should be limited. Chicken and [place] These are good sources of protein. The fat of poultry can be reduced by removing the skin and underlying fat layers before cooking. Chicken and [place] can be substituted for lean red meat in the diet. Poultry should not be fried or covered with high-fat sauces. Fish and Shellfish Fish is a good source of protein. Shellfish contain cholesterol, but they usually are low in saturated fatty acids. The preparation of fish is important. Like chicken and [place], they should not be fried or covered with high-fat sauces. EGGS Egg whites contain no fat or cholesterol. They can be eaten often. Try 1 to 2 egg whites instead of whole eggs in recipes or use egg substitutes that do not contain yolk. MILK AND DAIRY PRODUCTS Use skim or 1% milk instead of 2% or whole milk. Decrease whole milk, natural, and processed cheeses. Use nonfat or low-fat (2%) cottage cheese or low-fat cheeses made from vegetable oils. Choose nonfat or low-fat (1 to 2%) yogurt. Experiment with evaporated skim milk in recipes that call for heavy cream. Substitute low-fat yogurt or low-fat cottage cheese for sour cream in dips and salad dressings. Have at least 2 servings of low-fat dairy products, such as 2 glasses of skim (or 1%) milk each day to help get your daily calcium intake.  FATS AND OILS Reduce the total intake of fats, especially saturated fat. Butterfat,  lard, and beef fats are high in saturated fat and cholesterol. These should be avoided as much as possible. Vegetable fats do not contain cholesterol, but certain vegetable fats, such as coconut oil, palm oil, and palm kernel oil are very high in saturated fats. These should be limited. These fats are often used in bakery goods, processed foods, popcorn, oils, and nondairy creamers. Vegetable shortenings and some peanut butters contain hydrogenated oils, which are also saturated fats. Read the labels on these foods and check for saturated vegetable oils. Unsaturated vegetable oils and fats do not raise blood cholesterol. However, they should be limited because they are fats and are high in calories. Total fat should still be limited to 30% of your daily  caloric intake. Desirable liquid vegetable oils are corn oil, cottonseed oil, olive oil, canola oil, safflower oil, soybean oil, and sunflower oil. Peanut oil is not as good, but small amounts are acceptable. Buy a heart-healthy tub margarine that has no partially hydrogenated oils in the ingredients. Mayonnaise and salad dressings often are made from unsaturated fats, but they should also be limited because of their high calorie and fat content. Seeds, nuts, peanut butter, olives, and avocados are high in fat, but the fat is mainly the unsaturated type. These foods should be limited mainly to avoid excess calories and fat. OTHER EATING TIPS Snacks  Most sweets should be limited as snacks. They tend to be rich in calories and fats, and their caloric content outweighs their nutritional value. Some good choices in snacks are graham crackers, melba toast, soda crackers, bagels (no egg), English muffins, fruits, and vegetables. These snacks are preferable to snack crackers, [place] fries, and chips. Popcorn should be air-popped or cooked in small amounts of liquid vegetable oil. Desserts Eat fruit, low-fat yogurt, and fruit ices. AVOID pastries, cake, and cookies.  Sherbet, angel food cake, gelatin dessert, frozen low-fat yogurt, or other frozen products that do not contain saturated fat (pure fruit juice bars, frozen ice pops) are also acceptable.  COOKING METHODS Choose those methods that use little or no fat. They include: Poaching.  Braising.  Steaming.  Grilling.  Baking.  Stir-frying.  Broiling.  Microwaving.  Foods can be cooked in a nonstick pan without added fat, or use a nonfat cooking spray in regular cookware. Limit fried foods and avoid frying in saturated fat. Add moisture to lean meats by using water, broth, cooking wines, and other nonfat or low-fat sauces along with the cooking methods mentioned above. Soups and stews should be chilled after cooking. The fat that forms on top after a few hours in the refrigerator should be skimmed off. When preparing meals, avoid using excess salt. Salt can contribute to raising blood pressure in some people. EATING AWAY FROM HOME Order entres, potatoes, and vegetables without sauces or butter. When meat exceeds the size of a deck of cards (3 to 4 ounces), the rest can be taken home for another meal. Choose vegetable or fruit salads and ask for low-calorie salad dressings to be served on the side. Use dressings sparingly. Limit high-fat toppings, such as bacon, crumbled eggs, cheese, sunflower seeds, and olives. Ask for heart-healthy tub margarine instead of butter.   Diverticulosis Diverticulosis is a common condition that develops when small pouches (diverticula) form in the wall of the colon. The risk of diverticulosis increases with age. It happens more often in people who eat a low-fiber diet. Most individuals with diverticulosis have no symptoms. Those individuals with symptoms usually experience belly (abdominal) pain, constipation, or loose stools (diarrhea).  HOME CARE INSTRUCTIONS  Increase the amount of fiber in your diet as directed by your caregiver or dietician. This may reduce symptoms of  diverticulosis.   Drink at least 6 to 8 glasses of water each day to prevent constipation.   Try not to strain when you have a bowel movement.   Avoiding nuts and seeds to prevent complications is still an uncertain benefit.       FOODS HAVING HIGH FIBER CONTENT INCLUDE:  Fruits. Apple, peach, pear, tangerine, raisins, prunes.   Vegetables. Brussels sprouts, asparagus, broccoli, cabbage, carrot, cauliflower, romaine lettuce, spinach, summer squash, tomato, winter squash, zucchini.   Starchy Vegetables. Baked beans, kidney beans, lima beans, split peas,  lentils, potatoes (with skin).   Grains. Whole wheat bread, brown rice, bran flake cereal, plain oatmeal, white rice, shredded wheat, bran muffins.   Polyps, Colon  A polyp is extra tissue that grows inside your body. Colon polyps grow in the large intestine. The large intestine, also called the colon, is part of your digestive system. It is a long, hollow tube at the end of your digestive tract where your body makes and stores stool. Most polyps are not dangerous. They are benign. This means they are not cancerous. But over time, some types of polyps can turn into cancer. Polyps that are smaller than a pea are usually not harmful. But larger polyps could someday become or may already be cancerous. To be safe, doctors remove all polyps and test them.   WHO GETS POLYPS? Anyone can get polyps, but certain people are more likely than others. You may have a greater chance of getting polyps if:  You are over 50.   You have had polyps before.   Someone in your family has had polyps.   Someone in your family has had cancer of the large intestine.   Find out if someone in your family has had polyps. You may also be more likely to get polyps if you:   Eat a lot of fatty foods   Smoke   Drink alcohol   Do not exercise  Eat too much   TREATMENT  The caregiver will remove the polyp during sigmoidoscopy or colonoscopy.   PREVENTION There is not one sure way to prevent polyps. You might be able to lower your risk of getting them if you:  Eat more fruits and vegetables and less fatty food.   Do not smoke.   Avoid alcohol.   Exercise every day.   Lose weight if you are overweight.   Eating more calcium and folate can also lower your risk of getting polyps. Some foods that are rich in calcium are milk, cheese, and broccoli. Some foods that are rich in folate are chickpeas, kidney beans, and spinach.

## 2015-01-30 ENCOUNTER — Encounter (HOSPITAL_COMMUNITY): Payer: Self-pay | Admitting: Gastroenterology

## 2015-02-15 ENCOUNTER — Telehealth: Payer: Self-pay | Admitting: Gastroenterology

## 2015-02-15 NOTE — Telephone Encounter (Signed)
Please call pt. He had A TWO SERRATED adenomas removed. His stomach Bx shows gastritis.    CONTINUE YOUR WEIGHT LOSS EFFORTS. LOSE 10 MORE LBS. DRINK WATER TO KEEP YOUR URINE LIGHT YELLOW. FOLLOW A HIGH FIBER/LOW FAT DIET. AVOID ITEMS THAT CAUSE BLOATING.  AVOID ITEMS THAT TRIGGER GASTRITIS.  FOLLOW UP IN 6 MOS e30 MELENA/GASTRITIS.  Next colonoscopy in 5 years.  YOUR SISTERS, BROTHERS, CHILDREN, AND PARENTS NEED TO HAVE A COLONOSCOPY STARTING AT THE AGE OF 40.

## 2015-02-18 ENCOUNTER — Encounter: Payer: Self-pay | Admitting: Gastroenterology

## 2015-02-18 NOTE — Telephone Encounter (Signed)
APPT MADE AND ON RECALL  °

## 2015-02-18 NOTE — Telephone Encounter (Signed)
Pt is aware.  

## 2015-03-17 ENCOUNTER — Telehealth: Payer: Self-pay | Admitting: Gastroenterology

## 2015-03-17 DIAGNOSIS — R7989 Other specified abnormal findings of blood chemistry: Secondary | ICD-10-CM

## 2015-03-17 DIAGNOSIS — R945 Abnormal results of liver function studies: Secondary | ICD-10-CM

## 2015-03-17 NOTE — Telephone Encounter (Signed)
Please have patient complete LFTs, hepatitis B surface antigen, HCV antibody, iron/ribc, ferritin prior to his OV in 08/2015

## 2015-03-20 NOTE — Telephone Encounter (Signed)
What is the diagnosis? 

## 2015-03-20 NOTE — Telephone Encounter (Signed)
Dx: abnormal LFTs.

## 2015-03-25 NOTE — Telephone Encounter (Signed)
Lab orders on file.  This is an [facility] pt.

## 2015-03-25 NOTE — Addendum Note (Signed)
Addended by: Claudina Lick on: 03/25/2015 11:17 AM   Modules accepted: Orders

## 2015-07-29 ENCOUNTER — Other Ambulatory Visit: Payer: Self-pay

## 2015-07-29 DIAGNOSIS — R945 Abnormal results of liver function studies: Secondary | ICD-10-CM

## 2015-07-29 DIAGNOSIS — R7989 Other specified abnormal findings of blood chemistry: Secondary | ICD-10-CM

## 2015-08-13 LAB — HEPATIC FUNCTION PANEL
ALT: 15 U/L (ref 9–46)
AST: 13 U/L (ref 10–35)
Albumin: 3.7 g/dL (ref 3.6–5.1)
Alkaline Phosphatase: 69 U/L (ref 40–115)
BILIRUBIN DIRECT: 0.2 mg/dL (ref <=0.2)
BILIRUBIN INDIRECT: 0.8 mg/dL (ref 0.2–1.2)
BILIRUBIN TOTAL: 1 mg/dL (ref 0.2–1.2)
Total Protein: 6.4 g/dL (ref 6.1–8.1)

## 2015-08-13 LAB — FERRITIN: FERRITIN: 356 ng/mL (ref 20–380)

## 2015-08-13 LAB — HEPATITIS B SURFACE ANTIGEN: Hepatitis B Surface Ag: NEGATIVE

## 2015-08-13 LAB — IRON AND TIBC
%SAT: 57 % (ref 15–60)
IRON: 161 ug/dL (ref 50–180)
TIBC: 283 ug/dL (ref 250–425)
UIBC: 122 ug/dL — AB (ref 125–400)

## 2015-08-13 LAB — HEPATITIS C ANTIBODY: HCV AB: NEGATIVE

## 2015-08-19 ENCOUNTER — Ambulatory Visit (INDEPENDENT_AMBULATORY_CARE_PROVIDER_SITE_OTHER): Payer: Self-pay | Admitting: Nurse Practitioner

## 2015-08-19 ENCOUNTER — Encounter: Payer: Self-pay | Admitting: Nurse Practitioner

## 2015-08-19 ENCOUNTER — Encounter: Payer: Self-pay | Admitting: Gastroenterology

## 2015-08-19 VITALS — BP 125/82 | HR 103 | Temp 97.3°F | Ht 70.0 in | Wt 219.2 lb

## 2015-08-19 DIAGNOSIS — Z86010 Personal history of colonic polyps: Secondary | ICD-10-CM | POA: Insufficient documentation

## 2015-08-19 DIAGNOSIS — K2960 Other gastritis without bleeding: Secondary | ICD-10-CM

## 2015-08-19 DIAGNOSIS — K2900 Acute gastritis without bleeding: Secondary | ICD-10-CM

## 2015-08-19 DIAGNOSIS — F1010 Alcohol abuse, uncomplicated: Secondary | ICD-10-CM

## 2015-08-19 MED ORDER — OMEPRAZOLE 20 MG PO CPDR: DELAYED_RELEASE_CAPSULE | ORAL | Status: AC

## 2015-08-19 NOTE — Progress Notes (Signed)
cc'ed to pcp °

## 2015-08-19 NOTE — Progress Notes (Signed)
Referring Provider: Stephens Shire, MD Primary Care Physician:  Stephens Shire, MD Primary GI:  Dr. Oneida Alar  Chief Complaint  Patient presents with  . Follow-up    HPI:   Cody [name] is a 50 y.o. male who presents for follow-up on gastritis. He was last seen in our office 10/19/2014 to reschedule EGD. Noted history of alcohol abuse, symptoms of esophageal burning, melena, odynophagia. Quit drinking alcohol for 2 months after hospitalization but had recently began drinking again. He was set up for first-ever screening colonoscopy as well as endoscopy on propofol sedation. Colonoscopy and endoscopy were completed 01/29/2015. Colonoscopy findings include left: Redundant, 2 rectal sessile polyps removed, moderate diverticulosis. 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 EGD found severe erosive gastritis in the gastric body and gastric antrum, melena most likely due to peptic ulcer disease and ulcers which are now erosive gastritis, moderate duodenitis. Recommended continue weight loss efforts, adequate hydration, high-fiber/low-fat diet, avoid trigger foods, follow-up in 6 months with next colonoscopy in 5-10 years with an overtube. Esophageal biopsy found reactive gastropathy without H. pylori.  Today he states his GERD symptoms are well controlled. Has quit drinking and is therefore is having issues sleeping, but he's working through it. States he is not taking Prilosec currently. Is only taking a BP medication right now. Takes NSAID once a week or once a month. Denies abdominal pain, N/V, hematochezia, melena. He feels quitting drinking has fixed a lot of his problems. Denies chest pain, dyspnea, dizziness, lightheadedness, syncope, near syncope. Denies any other upper or lower GI symptoms.  Past Medical History  Diagnosis Date  . Arthritis   . COPD (chronic obstructive pulmonary disease) ([place])   . Depression   . Chronic chest pain     . Hypertension   . Alcohol abuse 08/16/2014  . Tobacco abuse 08/16/2014  . Anemia     Past Surgical History  Procedure Laterality Date  . Kidney tube surgery as a child    . Colonoscopy with propofol N/A 01/29/2015    Procedure: COLONOSCOPY WITH PROPOFOL;  Surgeon: Danie Binder, MD;  Location: [facility];  Service: Endoscopy;  Laterality: N/A;  in cecum @ 1349, out @ 1407  . Esophagogastroduodenoscopy (egd) with propofol N/A 01/29/2015    Procedure: ESOPHAGOGASTRODUODENOSCOPY (EGD) WITH PROPOFOL;  Surgeon: Danie Binder, MD;  Location: [facility];  Service: Endoscopy;  Laterality: N/A;  . Polypectomy N/A 01/29/2015    Procedure: POLYPECTOMY;  Surgeon: Danie Binder, MD;  Location: [facility];  Service: Endoscopy;  Laterality: N/A;  . Biopsy N/A 01/29/2015    Procedure: BIOPSY;  Surgeon: Danie Binder, MD;  Location: [facility];  Service: Endoscopy;  Laterality: N/A;    Current Outpatient Prescriptions  Medication Sig Dispense Refill  . ibuprofen (ADVIL,MOTRIN) 200 MG tablet Take 400 mg by mouth daily as needed for moderate pain.    Marland Kitchen lisinopril-hydrochlorothiazide (PRINZIDE,ZESTORETIC) 10-12.5 MG tablet Take 1 tablet by mouth daily.    Marland Kitchen amLODipine (NORVASC) 10 MG tablet Take 10 mg by mouth daily. Reported on 08/19/2015    . omeprazole (PRILOSEC) 20 MG capsule 1 PO 30 mins prior to breakfast and supper 60 capsule 11   No current facility-administered medications for this visit.    Allergies as of 08/19/2015  . (No Known Allergies)    Family History  Problem Relation Age of Onset  . Cancer Other     mother's side  . Diabetes Other   .  COPD Father   . Breast cancer Mother   . Colon cancer Neg Hx   . Liver disease Neg Hx     Social History   Social History  . Marital Status: Single    Spouse Name: N/A  . Number of Children: 0  . Years of Education: N/A   Occupational History  . Dealer    Social History Main Topics  . Smoking status: Current Every Day Smoker -- 1.50 packs/day  for 35 years    Types: Cigarettes  . Smokeless tobacco: Never Used     Comment: one pack daily  . Alcohol Use: No     Comment: Quit drinking, last ETOH use Febuary 15 2017. Previously would drink hard liquor daily 1-2 gallons a week.  . Drug Use: Yes    Special: Marijuana     Comment: last used 01/21/2015.  Marland Kitchen Sexual Activity: Yes    Birth Control/ Protection: None   Other Topics Concern  . None   Social History Narrative    Review of Systems: 10-point ROS negative except as per HPI.  Physical Exam: BP 125/82 mmHg  Pulse 103  Temp(Src) 97.3 F (36.3 C) (Oral)  Ht 5\' 10"  (1.778 m)  Wt 219 lb 3.2 oz (99.428 kg)  BMI 31.45 kg/m2 General:   Alert and oriented. Pleasant and cooperative. Well-nourished and well-developed.  Eyes:  Without icterus, sclera clear and conjunctiva pink.  Ears:  Normal auditory acuity. Cardiovascular:  S1, S2 present without murmurs appreciated. Extremities without clubbing or edema. Respiratory:  Clear to auscultation bilaterally. No wheezes, rales, or rhonchi. No distress.  Gastrointestinal:  +BS, soft, non-tender and non-distended. No HSM noted. No guarding or rebound. No masses appreciated.  Rectal:  Deferred  Neurologic:  Alert and oriented x4;  grossly normal neurologically. Psych:  Alert and cooperative. Normal mood and affect. Heme/Lymph/Immune: No excessive bruising noted.    08/19/2015 10:55 AM   Disclaimer: This note was dictated with voice recognition software. Similar sounding words can inadvertently be transcribed and may not be corrected upon review.

## 2015-08-19 NOTE — Patient Instructions (Addendum)
1. Take Prilosec 20 mg twice a day, 30 minutes before breakfast and 30 minutes before dinner. 2. Avoid NSAIDs (ibuprofen, Advil, Aleve, naproxen, Naprosyn, etc.) and aspirin powders such as Goody powders and BC powders.  3. Continue to avoid/abstain from alcohol. 4. Make sure you're drinking enough water and having adequate fiber intake. You can use over-the-counter fiber supplement such as Benefiber or Metamucil to help promote complete emptying with bowel movements. 5. Return for follow-up in 6 months.

## 2015-08-19 NOTE — Assessment & Plan Note (Signed)
2 sessile rectal polyps removed and found to be sessile serrated polyps without cytologic dysplasia on surgical pathology. Recommended 5 year repeat colonoscopy, he is on the recall list for this.

## 2015-08-19 NOTE — Assessment & Plan Note (Signed)
He is making great strides to abstinence from alcohol. Used to drink 1-2 gallons of liquor a week, last liquor intake February 2017. States he is committed to abstaining. Recommend he do so. Return for follow-up in 6 months.

## 2015-08-19 NOTE — Assessment & Plan Note (Signed)
Noted erosive gastritis on recent upper endoscopy, improved from previously noted ulcers with associated melena. His situation is likely improved by his abstinence from alcohol. Recommended continue abstain from alcohol and NSAIDs/ASA powders. I will start him on Prilosec 20 mg daily for continued healing, unsure why he discontinued this. Return for follow-up in 6 months.

## 2015-08-23 NOTE — Progress Notes (Signed)
Quick Note:  Please let patient know his LFTs are normal! Likely direct reflection of improved fatty liver and etoh cessation. ______

## 2015-08-26 NOTE — Progress Notes (Signed)
Quick Note:  Pt is aware and was so happy to hear this. ______

## 2015-08-29 ENCOUNTER — Telehealth: Payer: Self-pay | Admitting: Gastroenterology

## 2015-08-29 ENCOUNTER — Other Ambulatory Visit: Payer: Self-pay

## 2015-08-29 DIAGNOSIS — R109 Unspecified abdominal pain: Secondary | ICD-10-CM

## 2015-08-29 DIAGNOSIS — R1011 Right upper quadrant pain: Secondary | ICD-10-CM

## 2015-08-29 NOTE — Telephone Encounter (Signed)
Pt called this afternoon saying that he had seen his PCP today and was recommended to follow up with his about his gallstones and being seen sooner. Pt was seen by [place] on June 12 and has a 6 month follow up. Please advise if we need to bring him back in or if he needs to be referred to a surgeon. I transferred call to VM and patient was aware that DS was on the other line.

## 2015-08-29 NOTE — Telephone Encounter (Signed)
I spoke to pt and he said he has had some right side pain for a couple of weeks and at first he thought he had hurt it or something, but it has been getting worse. He went to see his PCP Dr. Marlyn Corporal at [facility] and he thinks it is his gall bladder, but said it would be a couple of weeks before they could schedule his [place]. Pt said the pain is too bad to wait that long.  I spoke to Neil Crouch, PA and she is ordering a RUQ abdominal US and some labs.

## 2015-08-29 NOTE — Telephone Encounter (Signed)
STAT ruq u/s at [facility]. Labs tomorrow AM at [facility] lab, STAT. LFTs, lipase, CBC

## 2015-08-29 NOTE — Telephone Encounter (Signed)
I called and told pt per Ginger to be at [facility] at 8:00 AM Friday AM for the STAT US and also the STAT blood work. I have faxed the lab orders to [facility] lab.

## 2015-08-29 NOTE — Telephone Encounter (Signed)
Pt will be there at 8:00 am he is aware

## 2015-08-30 ENCOUNTER — Ambulatory Visit (HOSPITAL_COMMUNITY)
Admission: RE | Admit: 2015-08-30 | Discharge: 2015-08-30 | Disposition: A | Discharge status: Home / Self Care | Payer: BLUE CROSS/BLUE SHIELD | Source: Ambulatory Visit | Attending: Gastroenterology | Admitting: Gastroenterology

## 2015-08-30 ENCOUNTER — Other Ambulatory Visit: Payer: Self-pay | Admitting: Gastroenterology

## 2015-08-30 ENCOUNTER — Other Ambulatory Visit (HOSPITAL_COMMUNITY)
Admission: RE | Admit: 2015-08-30 | Discharge: 2015-08-30 | Disposition: A | Discharge status: Home / Self Care | Payer: BLUE CROSS/BLUE SHIELD | Source: Ambulatory Visit | Attending: Gastroenterology | Admitting: Gastroenterology

## 2015-08-30 DIAGNOSIS — R1011 Right upper quadrant pain: Secondary | ICD-10-CM | POA: Insufficient documentation

## 2015-08-30 DIAGNOSIS — K8020 Calculus of gallbladder without cholecystitis without obstruction: Secondary | ICD-10-CM | POA: Diagnosis not present

## 2015-08-30 DIAGNOSIS — R932 Abnormal findings on diagnostic imaging of liver and biliary tract: Secondary | ICD-10-CM | POA: Diagnosis not present

## 2015-08-30 DIAGNOSIS — R109 Unspecified abdominal pain: Secondary | ICD-10-CM | POA: Insufficient documentation

## 2015-08-30 LAB — CBC WITH DIFFERENTIAL/PLATELET
BASOS ABS: 0 10*3/uL (ref 0.0–0.1)
BASOS PCT: 1 %
Eosinophils Absolute: 0.1 10*3/uL (ref 0.0–0.7)
Eosinophils Relative: 2 %
HEMATOCRIT: 51.7 % (ref 39.0–52.0)
HEMOGLOBIN: 18.2 g/dL — AB (ref 13.0–17.0)
Lymphocytes Relative: 33 %
Lymphs Abs: 2.3 10*3/uL (ref 0.7–4.0)
MCH: 37.5 pg — ABNORMAL HIGH (ref 26.0–34.0)
MCHC: 35.2 g/dL (ref 30.0–36.0)
MCV: 106.6 fL — ABNORMAL HIGH (ref 78.0–100.0)
MONOS PCT: 11 %
Monocytes Absolute: 0.8 10*3/uL (ref 0.1–1.0)
NEUTROS ABS: 3.9 10*3/uL (ref 1.7–7.7)
NEUTROS PCT: 53 %
Platelets: 311 10*3/uL (ref 150–400)
RBC: 4.85 MIL/uL (ref 4.22–5.81)
RDW: 13 % (ref 11.5–15.5)
WBC: 7.1 10*3/uL (ref 4.0–10.5)

## 2015-08-30 LAB — HEPATIC FUNCTION PANEL
ALT: 20 U/L (ref 17–63)
AST: 18 U/L (ref 15–41)
Albumin: 3.6 g/dL (ref 3.5–5.0)
Alkaline Phosphatase: 61 U/L (ref 38–126)
BILIRUBIN DIRECT: 0.2 mg/dL (ref 0.1–0.5)
BILIRUBIN INDIRECT: 0.6 mg/dL (ref 0.3–0.9)
Total Bilirubin: 0.8 mg/dL (ref 0.3–1.2)
Total Protein: 6.8 g/dL (ref 6.5–8.1)

## 2015-08-30 LAB — LIPASE, BLOOD: LIPASE: 15 U/L (ref 11–51)

## 2015-08-30 MED ORDER — HYDROCODONE-ACETAMINOPHEN 5-325 MG PO TABS: 1.0000 | ORAL_TABLET | Freq: Four times a day (QID) | ORAL | Status: DC | PRN

## 2015-08-30 NOTE — Progress Notes (Signed)
Quick Note:  Pt is aware of all of the results/recommendations including low fat diet until he sees the surgeon. He prefers to go to [facility].  He has the Rx for bid Omeprazole.  He said he really does need something for pain and I told him I am not sure they will give him something for pain but I can check and see. ______

## 2015-08-30 NOTE — Progress Notes (Signed)
Quick Note:  Per Tom at [facility] to take prescription by [facility] in [place] and pt will have to pick up there. Pt is aware and Magda Paganini is dropping it off in about 10-15 min. ______

## 2015-08-30 NOTE — Progress Notes (Signed)
Quick Note:  Please let patient know that his ultrasound shows again gallstones but no evidence of acute cholecystitis. His labs are unremarkable. LFTs are normal. Suspect symptomatic gallstones however cannot exclude upper GI etiology such as gastritis/peptic ulcer disease but feel less likely.  Let's give him a surgical referral for elective cholecystectomy, if he wants to go to [facility] please send to [facility] surgery, if he prefers  [facility] please send a Dr. Runell Gess. Rosana Hoes.  Let's have him increase his omeprazole to 20 mg twice daily to see if this helps. Can provide prescription with #60 one refill. ______

## 2015-08-30 NOTE — Progress Notes (Signed)
Quick Note:  To Ginger for the referral. ______

## 2015-08-30 NOTE — Progress Notes (Signed)
Quick Note:  RX one time for pain med. If pain severe, go to er. ______

## 2015-08-30 NOTE — Progress Notes (Signed)
Quick Note:  Cody [name], I faxed the RX and called to let [facility] know. They said they can no longer take it by fax, but they can take it e-prescribed now if you are set up for e-scribe. I told Gershon Mussel we are on e-scribe and he said to send it over. ______

## 2015-09-02 ENCOUNTER — Other Ambulatory Visit: Payer: Self-pay

## 2015-09-02 DIAGNOSIS — K8020 Calculus of gallbladder without cholecystitis without obstruction: Secondary | ICD-10-CM

## 2015-09-13 ENCOUNTER — Ambulatory Visit: Payer: Self-pay | Admitting: General Surgery

## 2015-09-20 ENCOUNTER — Encounter (HOSPITAL_COMMUNITY): Payer: Self-pay | Admitting: *Deleted

## 2015-09-23 ENCOUNTER — Encounter (HOSPITAL_COMMUNITY)
Admission: RE | Disposition: A | Discharge status: Home / Self Care | Payer: Self-pay | Source: Ambulatory Visit | Attending: General Surgery

## 2015-09-23 ENCOUNTER — Ambulatory Visit (HOSPITAL_COMMUNITY): Payer: BLUE CROSS/BLUE SHIELD | Admitting: Certified Registered Nurse Anesthetist

## 2015-09-23 ENCOUNTER — Encounter (HOSPITAL_COMMUNITY): Payer: Self-pay | Admitting: *Deleted

## 2015-09-23 ENCOUNTER — Ambulatory Visit (HOSPITAL_COMMUNITY): Payer: BLUE CROSS/BLUE SHIELD

## 2015-09-23 ENCOUNTER — Observation Stay (HOSPITAL_COMMUNITY)
Admission: RE | Admit: 2015-09-23 | Discharge: 2015-09-24 | Disposition: A | Discharge status: Home / Self Care | Payer: BLUE CROSS/BLUE SHIELD | Source: Ambulatory Visit | Attending: General Surgery | Admitting: General Surgery

## 2015-09-23 DIAGNOSIS — K8012 Calculus of gallbladder with acute and chronic cholecystitis without obstruction: Principal | ICD-10-CM | POA: Insufficient documentation

## 2015-09-23 DIAGNOSIS — K8010 Calculus of gallbladder with chronic cholecystitis without obstruction: Secondary | ICD-10-CM | POA: Diagnosis present

## 2015-09-23 DIAGNOSIS — I10 Essential (primary) hypertension: Secondary | ICD-10-CM | POA: Diagnosis not present

## 2015-09-23 DIAGNOSIS — Z79899 Other long term (current) drug therapy: Secondary | ICD-10-CM | POA: Insufficient documentation

## 2015-09-23 DIAGNOSIS — Z6833 Body mass index (BMI) 33.0-33.9, adult: Secondary | ICD-10-CM | POA: Diagnosis not present

## 2015-09-23 DIAGNOSIS — F1010 Alcohol abuse, uncomplicated: Secondary | ICD-10-CM | POA: Diagnosis present

## 2015-09-23 DIAGNOSIS — J449 Chronic obstructive pulmonary disease, unspecified: Secondary | ICD-10-CM | POA: Diagnosis present

## 2015-09-23 DIAGNOSIS — E669 Obesity, unspecified: Secondary | ICD-10-CM | POA: Insufficient documentation

## 2015-09-23 DIAGNOSIS — K219 Gastro-esophageal reflux disease without esophagitis: Secondary | ICD-10-CM | POA: Diagnosis not present

## 2015-09-23 DIAGNOSIS — Z419 Encounter for procedure for purposes other than remedying health state, unspecified: Secondary | ICD-10-CM

## 2015-09-23 DIAGNOSIS — F17200 Nicotine dependence, unspecified, uncomplicated: Secondary | ICD-10-CM | POA: Insufficient documentation

## 2015-09-23 DIAGNOSIS — R1011 Right upper quadrant pain: Secondary | ICD-10-CM | POA: Diagnosis present

## 2015-09-23 DIAGNOSIS — Z720 Tobacco use: Secondary | ICD-10-CM | POA: Diagnosis present

## 2015-09-23 DIAGNOSIS — Z9049 Acquired absence of other specified parts of digestive tract: Secondary | ICD-10-CM

## 2015-09-23 HISTORY — DX: Reserved for inherently not codable concepts without codable children: IMO0001

## 2015-09-23 HISTORY — DX: Calculus of kidney: N20.0

## 2015-09-23 HISTORY — PX: CHOLECYSTECTOMY: SHX55

## 2015-09-23 HISTORY — DX: Gastro-esophageal reflux disease without esophagitis: K21.9

## 2015-09-23 LAB — COMPREHENSIVE METABOLIC PANEL
ALT: 12 U/L — AB (ref 17–63)
AST: 16 U/L (ref 15–41)
Albumin: 3.3 g/dL — ABNORMAL LOW (ref 3.5–5.0)
Alkaline Phosphatase: 64 U/L (ref 38–126)
Anion gap: 9 (ref 5–15)
BUN: 13 mg/dL (ref 6–20)
CHLORIDE: 103 mmol/L (ref 101–111)
CO2: 28 mmol/L (ref 22–32)
CREATININE: 0.9 mg/dL (ref 0.61–1.24)
Calcium: 9.2 mg/dL (ref 8.9–10.3)
Glucose, Bld: 103 mg/dL — ABNORMAL HIGH (ref 65–99)
POTASSIUM: 3.8 mmol/L (ref 3.5–5.1)
SODIUM: 140 mmol/L (ref 135–145)
TOTAL PROTEIN: 6.4 g/dL — AB (ref 6.5–8.1)
Total Bilirubin: 1.2 mg/dL (ref 0.3–1.2)

## 2015-09-23 LAB — CBC WITH DIFFERENTIAL/PLATELET
Basophils Absolute: 0 10*3/uL (ref 0.0–0.1)
Basophils Relative: 0 %
EOS ABS: 0.1 10*3/uL (ref 0.0–0.7)
EOS PCT: 1 %
HCT: 52.3 % — ABNORMAL HIGH (ref 39.0–52.0)
Hemoglobin: 17.8 g/dL — ABNORMAL HIGH (ref 13.0–17.0)
LYMPHS ABS: 1.9 10*3/uL (ref 0.7–4.0)
Lymphocytes Relative: 18 %
MCH: 37.5 pg — ABNORMAL HIGH (ref 26.0–34.0)
MCHC: 34 g/dL (ref 30.0–36.0)
MCV: 110.1 fL — AB (ref 78.0–100.0)
MONO ABS: 0.7 10*3/uL (ref 0.1–1.0)
Monocytes Relative: 7 %
NEUTROS PCT: 74 %
Neutro Abs: 7.8 10*3/uL — ABNORMAL HIGH (ref 1.7–7.7)
PLATELETS: 242 10*3/uL (ref 150–400)
RBC: 4.75 MIL/uL (ref 4.22–5.81)
RDW: 13.8 % (ref 11.5–15.5)
WBC: 10.5 10*3/uL (ref 4.0–10.5)

## 2015-09-23 SURGERY — LAPAROSCOPIC CHOLECYSTECTOMY WITH INTRAOPERATIVE CHOLANGIOGRAM
Anesthesia: General | Site: Abdomen

## 2015-09-23 MED ORDER — LOSARTAN POTASSIUM 50 MG PO TABS
100.0000 mg | ORAL_TABLET | Freq: Every day | ORAL | Status: DC
  Administered 2015-09-23: 100 mg via ORAL
  Filled 2015-09-23: qty 2

## 2015-09-23 MED ORDER — HYDROMORPHONE HCL 1 MG/ML IJ SOLN
INTRAMUSCULAR | Status: AC
  Filled 2015-09-23: qty 1

## 2015-09-23 MED ORDER — LOSARTAN POTASSIUM-HCTZ 100-12.5 MG PO TABS: 1.0000 | ORAL_TABLET | Freq: Every day | ORAL | Status: DC

## 2015-09-23 MED ORDER — DEXAMETHASONE SODIUM PHOSPHATE 10 MG/ML IJ SOLN
INTRAMUSCULAR | Status: DC | PRN
  Administered 2015-09-23: 10 mg via INTRAVENOUS

## 2015-09-23 MED ORDER — ACETAMINOPHEN 500 MG PO TABS
1000.0000 mg | ORAL_TABLET | Freq: Four times a day (QID) | ORAL | Status: DC
  Administered 2015-09-24 (×2): 1000 mg via ORAL
  Filled 2015-09-23 (×2): qty 2

## 2015-09-23 MED ORDER — LACTATED RINGERS IR SOLN
Status: DC | PRN
  Administered 2015-09-23: 1000 mL

## 2015-09-23 MED ORDER — ONDANSETRON HCL 4 MG/2ML IJ SOLN
INTRAMUSCULAR | Status: AC
  Filled 2015-09-23: qty 4

## 2015-09-23 MED ORDER — BUPIVACAINE-EPINEPHRINE 0.25% -1:200000 IJ SOLN
INTRAMUSCULAR | Status: DC | PRN
  Administered 2015-09-23: 23 mL

## 2015-09-23 MED ORDER — MIDAZOLAM HCL 2 MG/2ML IJ SOLN
INTRAMUSCULAR | Status: AC
  Filled 2015-09-23: qty 2

## 2015-09-23 MED ORDER — SUGAMMADEX SODIUM 500 MG/5ML IV SOLN
INTRAVENOUS | Status: AC
  Filled 2015-09-23: qty 5

## 2015-09-23 MED ORDER — LIDOCAINE HCL (CARDIAC) 20 MG/ML IV SOLN
INTRAVENOUS | Status: DC | PRN
  Administered 2015-09-23: 50 mg via INTRAVENOUS

## 2015-09-23 MED ORDER — PROPOFOL 10 MG/ML IV BOLUS
INTRAVENOUS | Status: AC
  Filled 2015-09-23: qty 20

## 2015-09-23 MED ORDER — ALBUTEROL SULFATE HFA 108 (90 BASE) MCG/ACT IN AERS
INHALATION_SPRAY | RESPIRATORY_TRACT | Status: DC | PRN
  Administered 2015-09-23 (×2): 2 via RESPIRATORY_TRACT

## 2015-09-23 MED ORDER — ONDANSETRON HCL 4 MG/2ML IJ SOLN: 4.0000 mg | Freq: Four times a day (QID) | INTRAMUSCULAR | Status: DC | PRN

## 2015-09-23 MED ORDER — IOPAMIDOL (ISOVUE-300) INJECTION 61%
INTRAVENOUS | Status: AC
  Filled 2015-09-23: qty 50

## 2015-09-23 MED ORDER — CHLORHEXIDINE GLUCONATE CLOTH 2 % EX PADS: 6.0000 | MEDICATED_PAD | Freq: Once | CUTANEOUS | Status: DC

## 2015-09-23 MED ORDER — ENOXAPARIN SODIUM 40 MG/0.4ML ~~LOC~~ SOLN: 40.0000 mg | SUBCUTANEOUS | Status: DC

## 2015-09-23 MED ORDER — ACETAMINOPHEN 160 MG/5ML PO SOLN
325.0000 mg | ORAL | Status: DC | PRN
Start: 2015-09-23 — End: 2015-09-23

## 2015-09-23 MED ORDER — LIDOCAINE HCL (CARDIAC) 20 MG/ML IV SOLN
INTRAVENOUS | Status: AC
  Filled 2015-09-23: qty 5

## 2015-09-23 MED ORDER — 0.9 % SODIUM CHLORIDE (POUR BTL) OPTIME
TOPICAL | Status: DC | PRN
  Administered 2015-09-23: 1000 mL

## 2015-09-23 MED ORDER — SUGAMMADEX SODIUM 200 MG/2ML IV SOLN
INTRAVENOUS | Status: DC | PRN
  Administered 2015-09-23: 300 mg via INTRAVENOUS

## 2015-09-23 MED ORDER — BUPIVACAINE-EPINEPHRINE 0.25% -1:200000 IJ SOLN
INTRAMUSCULAR | Status: AC
Start: 2015-09-23 — End: 2015-09-23
  Filled 2015-09-23: qty 1

## 2015-09-23 MED ORDER — IOPAMIDOL (ISOVUE-300) INJECTION 61%
INTRAVENOUS | Status: DC | PRN
  Administered 2015-09-23: 6 mL

## 2015-09-23 MED ORDER — ONDANSETRON 4 MG PO TBDP
4.0000 mg | ORAL_TABLET | Freq: Four times a day (QID) | ORAL | Status: DC | PRN
Start: 2015-09-23 — End: 2015-09-24

## 2015-09-23 MED ORDER — FENTANYL CITRATE (PF) 250 MCG/5ML IJ SOLN
INTRAMUSCULAR | Status: AC
  Filled 2015-09-23: qty 5

## 2015-09-23 MED ORDER — ONDANSETRON HCL 4 MG/2ML IJ SOLN
INTRAMUSCULAR | Status: DC | PRN
  Administered 2015-09-23: 4 mg via INTRAVENOUS

## 2015-09-23 MED ORDER — OXYCODONE HCL 5 MG/5ML PO SOLN: 5.0000 mg | Freq: Once | ORAL | Status: DC | PRN

## 2015-09-23 MED ORDER — OXYCODONE HCL 5 MG PO TABS: 5.0000 mg | ORAL_TABLET | Freq: Once | ORAL | Status: DC | PRN

## 2015-09-23 MED ORDER — OXYCODONE HCL 5 MG PO TABS
5.0000 mg | ORAL_TABLET | ORAL | Status: DC | PRN
  Administered 2015-09-23 (×2): 5 mg via ORAL
  Administered 2015-09-24: 10 mg via ORAL
  Filled 2015-09-23 (×2): qty 1
  Filled 2015-09-23 (×2): qty 2

## 2015-09-23 MED ORDER — ACETAMINOPHEN 10 MG/ML IV SOLN
INTRAVENOUS | Status: AC
  Filled 2015-09-23: qty 100

## 2015-09-23 MED ORDER — DIPHENHYDRAMINE HCL 50 MG/ML IJ SOLN: 12.5000 mg | Freq: Four times a day (QID) | INTRAMUSCULAR | Status: DC | PRN

## 2015-09-23 MED ORDER — ACETAMINOPHEN 10 MG/ML IV SOLN
1000.0000 mg | Freq: Once | INTRAVENOUS | Status: AC
  Administered 2015-09-23: 1000 mg via INTRAVENOUS

## 2015-09-23 MED ORDER — FENTANYL CITRATE (PF) 100 MCG/2ML IJ SOLN
INTRAMUSCULAR | Status: DC | PRN
  Administered 2015-09-23 (×3): 50 ug via INTRAVENOUS
  Administered 2015-09-23: 100 ug via INTRAVENOUS
  Administered 2015-09-23 (×5): 50 ug via INTRAVENOUS

## 2015-09-23 MED ORDER — PROPOFOL 10 MG/ML IV BOLUS
INTRAVENOUS | Status: DC | PRN
  Administered 2015-09-23: 160 mg via INTRAVENOUS

## 2015-09-23 MED ORDER — ACETAMINOPHEN 325 MG PO TABS: 325.0000 mg | ORAL_TABLET | ORAL | Status: DC | PRN

## 2015-09-23 MED ORDER — ACETAMINOPHEN 160 MG/5ML PO SOLN: 325.0000 mg | ORAL | Status: DC | PRN

## 2015-09-23 MED ORDER — LACTATED RINGERS IV SOLN
INTRAVENOUS | Status: DC
  Administered 2015-09-23: 13:00:00 via INTRAVENOUS

## 2015-09-23 MED ORDER — PROMETHAZINE HCL 25 MG/ML IJ SOLN: 12.5000 mg | Freq: Four times a day (QID) | INTRAMUSCULAR | Status: DC | PRN

## 2015-09-23 MED ORDER — ROCURONIUM BROMIDE 100 MG/10ML IV SOLN
INTRAVENOUS | Status: DC | PRN
  Administered 2015-09-23: 50 mg via INTRAVENOUS
  Administered 2015-09-23: 10 mg via INTRAVENOUS

## 2015-09-23 MED ORDER — SODIUM CHLORIDE 0.9 % IV SOLN
INTRAVENOUS | Status: DC
  Administered 2015-09-23: 19:00:00 via INTRAVENOUS

## 2015-09-23 MED ORDER — PANTOPRAZOLE SODIUM 40 MG PO TBEC
40.0000 mg | DELAYED_RELEASE_TABLET | Freq: Every day | ORAL | Status: DC
  Administered 2015-09-23: 40 mg via ORAL
  Filled 2015-09-23: qty 1

## 2015-09-23 MED ORDER — NICOTINE 21 MG/24HR TD PT24
21.0000 mg | MEDICATED_PATCH | Freq: Every day | TRANSDERMAL | Status: DC
  Administered 2015-09-23 – 2015-09-24 (×2): 21 mg via TRANSDERMAL
  Filled 2015-09-23 (×4): qty 1

## 2015-09-23 MED ORDER — METHOCARBAMOL 500 MG PO TABS
500.0000 mg | ORAL_TABLET | Freq: Four times a day (QID) | ORAL | Status: DC | PRN
  Administered 2015-09-23: 500 mg via ORAL
  Filled 2015-09-23: qty 1

## 2015-09-23 MED ORDER — SUCCINYLCHOLINE CHLORIDE 20 MG/ML IJ SOLN
INTRAMUSCULAR | Status: DC | PRN
  Administered 2015-09-23: 100 mg via INTRAVENOUS

## 2015-09-23 MED ORDER — FENTANYL CITRATE (PF) 100 MCG/2ML IJ SOLN
INTRAMUSCULAR | Status: AC
  Filled 2015-09-23: qty 2

## 2015-09-23 MED ORDER — ROCURONIUM BROMIDE 100 MG/10ML IV SOLN
INTRAVENOUS | Status: AC
  Filled 2015-09-23: qty 1

## 2015-09-23 MED ORDER — HYDROMORPHONE HCL 1 MG/ML IJ SOLN
0.2500 mg | INTRAMUSCULAR | Status: DC | PRN
  Administered 2015-09-23 (×4): 0.5 mg via INTRAVENOUS

## 2015-09-23 MED ORDER — CEFOTETAN DISODIUM-DEXTROSE 2-2.08 GM-% IV SOLR
2.0000 g | INTRAVENOUS | Status: AC
  Administered 2015-09-23: 2 g via INTRAVENOUS

## 2015-09-23 MED ORDER — FENTANYL CITRATE (PF) 100 MCG/2ML IJ SOLN: 25.0000 ug | INTRAMUSCULAR | Status: DC | PRN

## 2015-09-23 MED ORDER — HYDRALAZINE HCL 20 MG/ML IJ SOLN: 10.0000 mg | INTRAMUSCULAR | Status: DC | PRN

## 2015-09-23 MED ORDER — METOPROLOL TARTRATE 5 MG/5ML IV SOLN
INTRAVENOUS | Status: DC | PRN
  Administered 2015-09-23: 1 mg via INTRAVENOUS

## 2015-09-23 MED ORDER — DIPHENHYDRAMINE HCL 12.5 MG/5ML PO ELIX: 12.5000 mg | ORAL_SOLUTION | Freq: Four times a day (QID) | ORAL | Status: DC | PRN

## 2015-09-23 MED ORDER — MAGNESIUM SULFATE 2 GM/50ML IV SOLN
2.0000 g | Freq: Once | INTRAVENOUS | Status: AC
  Administered 2015-09-23: 2 g via INTRAVENOUS
  Filled 2015-09-23: qty 50

## 2015-09-23 MED ORDER — HYDROCHLOROTHIAZIDE 12.5 MG PO CAPS
12.5000 mg | ORAL_CAPSULE | Freq: Every day | ORAL | Status: DC
  Administered 2015-09-23: 12.5 mg via ORAL
  Filled 2015-09-23: qty 1

## 2015-09-23 MED ORDER — FENTANYL CITRATE (PF) 100 MCG/2ML IJ SOLN
25.0000 ug | INTRAMUSCULAR | Status: DC | PRN
  Administered 2015-09-23 (×2): 25 ug via INTRAVENOUS

## 2015-09-23 MED ORDER — MORPHINE SULFATE (PF) 2 MG/ML IV SOLN: 1.0000 mg | INTRAVENOUS | Status: DC | PRN

## 2015-09-23 MED ORDER — MIDAZOLAM HCL 5 MG/5ML IJ SOLN
INTRAMUSCULAR | Status: DC | PRN
  Administered 2015-09-23: 2 mg via INTRAVENOUS

## 2015-09-23 MED ORDER — CEFOTETAN DISODIUM-DEXTROSE 2-2.08 GM-% IV SOLR
INTRAVENOUS | Status: AC
  Filled 2015-09-23: qty 50

## 2015-09-23 SURGICAL SUPPLY — 52 items
APL SKNCLS STERI-STRIP NONHPOA (GAUZE/BANDAGES/DRESSINGS) ×1
APL SRG 38 LTWT LNG FL B (MISCELLANEOUS)
APPLICATOR ARISTA FLEXITIP XL (MISCELLANEOUS) IMPLANT
APPLIER CLIP 5 13 M/L LIGAMAX5 (MISCELLANEOUS) ×3
APPLIER CLIP ROT 10 11.4 M/L (STAPLE)
APR CLP MED LRG 11.4X10 (STAPLE)
APR CLP MED LRG 5 ANG JAW (MISCELLANEOUS) ×1
BAG SPEC RTRVL 10 TROC 200 (ENDOMECHANICALS) ×1
BANDAGE ADH SHEER 1  50/CT (GAUZE/BANDAGES/DRESSINGS) ×10 IMPLANT
BENZOIN TINCTURE PRP APPL 2/3 (GAUZE/BANDAGES/DRESSINGS) ×3 IMPLANT
CABLE HIGH FREQUENCY MONO STRZ (ELECTRODE) ×3 IMPLANT
CHLORAPREP W/TINT 26ML (MISCELLANEOUS) ×3 IMPLANT
CLIP APPLIE 5 13 M/L LIGAMAX5 (MISCELLANEOUS) ×1 IMPLANT
CLIP APPLIE ROT 10 11.4 M/L (STAPLE) IMPLANT
CLOSURE STERI-STRIP 1/4X4 (GAUZE/BANDAGES/DRESSINGS) ×2 IMPLANT
CLOSURE WOUND 1/2 X4 (GAUZE/BANDAGES/DRESSINGS) ×1
COVER MAYO STAND STRL (DRAPES) IMPLANT
COVER SURGICAL LIGHT HANDLE (MISCELLANEOUS) ×3 IMPLANT
DECANTER SPIKE VIAL GLASS SM (MISCELLANEOUS) ×3 IMPLANT
DEVICE TROCAR PUNCTURE CLOSURE (ENDOMECHANICALS) ×2 IMPLANT
DRAPE C-ARM 42X120 X-RAY (DRAPES) ×2 IMPLANT
DRAPE LAPAROSCOPIC ABDOMINAL (DRAPES) IMPLANT
DRSG TEGADERM 2-3/8X2-3/4 SM (GAUZE/BANDAGES/DRESSINGS) ×3 IMPLANT
ELECT PENCIL ROCKER SW 15FT (MISCELLANEOUS) ×3 IMPLANT
ELECT REM PT RETURN 9FT ADLT (ELECTROSURGICAL) ×3
ELECTRODE REM PT RTRN 9FT ADLT (ELECTROSURGICAL) ×1 IMPLANT
GAUZE SPONGE 2X2 8PLY STRL LF (GAUZE/BANDAGES/DRESSINGS) ×1 IMPLANT
GLOVE BIO SURGEON STRL SZ7.5 (GLOVE) ×5 IMPLANT
GLOVE INDICATOR 8.0 STRL GRN (GLOVE) ×3 IMPLANT
GOWN STRL REUS W/TWL XL LVL3 (GOWN DISPOSABLE) ×9 IMPLANT
HEMOSTAT ARISTA ABSORB 3G PWDR (MISCELLANEOUS) IMPLANT
HEMOSTAT SNOW SURGICEL 2X4 (HEMOSTASIS) ×2 IMPLANT
KIT BASIN OR (CUSTOM PROCEDURE TRAY) ×3 IMPLANT
L-HOOK LAP DISP 36CM (ELECTROSURGICAL) ×3
LHOOK LAP DISP 36CM (ELECTROSURGICAL) ×1 IMPLANT
POUCH RETRIEVAL ECOSAC 10 (ENDOMECHANICALS) ×1 IMPLANT
POUCH RETRIEVAL ECOSAC 10MM (ENDOMECHANICALS) ×2
SCISSORS LAP 5X35 DISP (ENDOMECHANICALS) ×3 IMPLANT
SET CHOLANGIOGRAPH MIX (MISCELLANEOUS) ×2 IMPLANT
SET IRRIG TUBING LAPAROSCOPIC (IRRIGATION / IRRIGATOR) ×3 IMPLANT
SLEEVE XCEL OPT CAN 5 100 (ENDOMECHANICALS) ×6 IMPLANT
SPONGE GAUZE 2X2 STER 10/PKG (GAUZE/BANDAGES/DRESSINGS) ×2
STRIP CLOSURE SKIN 1/2X4 (GAUZE/BANDAGES/DRESSINGS) ×1 IMPLANT
SUT MNCRL AB 4-0 PS2 18 (SUTURE) ×5 IMPLANT
SUT VICRYL 0 UR6 27IN ABS (SUTURE) ×4 IMPLANT
TOWEL OR 17X26 10 PK STRL BLUE (TOWEL DISPOSABLE) ×3 IMPLANT
TOWEL OR NON WOVEN STRL DISP B (DISPOSABLE) ×3 IMPLANT
TRAY LAPAROSCOPIC (CUSTOM PROCEDURE TRAY) ×3 IMPLANT
TROCAR BLADELESS OPT 5 100 (ENDOMECHANICALS) ×3 IMPLANT
TROCAR XCEL BLUNT TIP 100MML (ENDOMECHANICALS) ×3 IMPLANT
TROCAR XCEL NON-BLD 11X100MML (ENDOMECHANICALS) IMPLANT
TUBING INSUF HEATED (TUBING) ×3 IMPLANT

## 2015-09-23 NOTE — Op Note (Signed)
Cody [name] [id number] 10/22/1965 09/23/2015  Laparoscopic Cholecystectomy with IOC Procedure Note  Indications: This patient presents with symptomatic gallbladder disease and will undergo laparoscopic cholecystectomy.  Pre-operative Diagnosis: symptomatic cholelithiasis  Post-operative Diagnosis: chronic calculous cholecystitis  Surgeon: [name]   Assistants: Servando Snare, RNFA  Anesthesia: General endotracheal anesthesia  Procedure Details  The patient was seen again in the Holding Room. The risks, benefits, complications, treatment options, and expected outcomes were discussed with the patient. The possibilities of reaction to medication, pulmonary aspiration, perforation of viscus, bleeding, recurrent infection, finding a normal gallbladder, the need for additional procedures, failure to diagnose a condition, the possible need to convert to an open procedure, and creating a complication requiring transfusion or operation were discussed with the patient. The likelihood of improving the patient's symptoms with return to their baseline status is good.  The patient and/or family concurred with the proposed plan, giving informed consent. The site of surgery properly noted. The patient was taken to Operating Room, identified as Cody [name] and the procedure verified as Laparoscopic Cholecystectomy with Intraoperative Cholangiogram. A Time Out was held and the above information confirmed. Antibiotic prophylaxis was administered.   Prior to the induction of general anesthesia, antibiotic prophylaxis was administered. General endotracheal anesthesia was then administered and tolerated well. After the induction, the abdomen was prepped with Chloraprep and draped in the sterile fashion. The patient was positioned in the supine position.  Local anesthetic agent was injected into the skin near the umbilicus and an incision made. We dissected down to the abdominal fascia with blunt dissection.   The fascia was incised vertically and we entered the peritoneal cavity bluntly.  A pursestring suture of 0-Vicryl was placed around the fascial opening.  The Hasson cannula was inserted and secured with the stay suture.  Pneumoperitoneum was then created with CO2 and tolerated well without any adverse changes in the patient's vital signs. An 5-mm port was placed in the subxiphoid position.  Two 5-mm ports were placed in the right upper quadrant. All skin incisions were infiltrated with a local anesthetic agent before making the incision and placing the trocars. He had a thin abdominal wall.   We positioned the patient in reverse Trendelenburg, tilted slightly to the patient's left.  The gallbladder was identified, the fundus grasped and retracted cephalad. Adhesions were lysed bluntly and with the electrocautery where indicated, taking care not to injure any adjacent organs or viscus. The infundibulum was grasped and retracted laterally, exposing the peritoneum overlying the triangle of Calot. This was then divided and exposed in a blunt fashion. A critical view of the cystic duct and cystic artery was obtained.  The cystic duct was clearly identified and bluntly dissected circumferentially. The cystic duct was ligated with a clip distally.   An incision was made in the cystic duct and the [facility] cholangiogram catheter introduced. The catheter was secured using a clip. A cholangiogram was then obtained which showed good visualization of the distal and proximal biliary tree with no sign of filling defects or obstruction.  Contrast flowed easily into the duodenum. The catheter was then removed.   The cystic duct was then ligated with clips and divided. The cystic artery which had been identified & dissected free was ligated with clips and divided as well. A posterior branch was clipped as well.   The gallbladder was dissected from the liver bed in retrograde fashion with the electrocautery. The gallbladder was  removed and placed in an Ecco sac.  The  gallbladder and Ecco sac were then removed through the umbilical port site. A small liver tear had occurred just to pt's right of falciform ligament during retraction of the gallbladder toward right shoulder. It was about 2.5 cm. The liver bed was irrigated and inspected. . Copious irrigation was utilized and was repeatedly aspirated until clear. Hemostasis was achieved with the electrocautery in the gallbladder fossa and in the liver tear. I did place a piece of surgical snow over the tear.   The pursestring suture was used to close the umbilical fascia.   Given his obesity and COPD and thin fascia I place another interrupted 0 vicryl at the umbilical fascia using a endoclose suture passer with laparoscopic guidance.  We again inspected the right upper quadrant for hemostasis.  The umbilical closure was inspected and there was no air leak and nothing trapped within the closure. Local was infiltrated at the umbilical fascia. Pneumoperitoneum was released as we removed the trocars.  4-0 Monocryl was used to close the skin.   Benzoin, steri-strips, and clean dressings were applied. The patient was then extubated and brought to the recovery room in stable condition. Instrument, sponge, and needle counts were correct at closure and at the conclusion of the case.   Findings: Chronic Cholecystitis with Cholelithiasis  Estimated Blood Loss: Minimal         Drains: none         Specimens: Gallbladder           Complications: None; patient tolerated the procedure well.         Disposition: PACU - hemodynamically stable.         Condition: stable  Leighton Ruff. Redmond Pulling, MD, FACS General, Bariatric, & Minimally Invasive Surgery [facility], [place]

## 2015-09-23 NOTE — Interval H&P Note (Signed)
History and Physical Interval Note:  09/23/2015 1:32 PM  Cody [name]  has presented today for surgery, with the diagnosis of Symptomatic cholelithiasis  The various methods of treatment have been discussed with the patient and family. After consideration of risks, benefits and other options for treatment, the patient has consented to  Procedure(s): LAPAROSCOPIC CHOLECYSTECTOMY WITH INTRAOPERATIVE CHOLANGIOGRAM (N/A) as a surgical intervention .  The patient's history has been reviewed, patient examined, no change in status, stable for surgery.  I have reviewed the patient's chart and labs.  Questions were answered to the patient's satisfaction.    Leighton Ruff. Redmond Pulling, MD, [place], Bariatric, & Minimally Invasive Surgery [facility], [place]   [facility]

## 2015-09-23 NOTE — Anesthesia Preprocedure Evaluation (Addendum)
Anesthesia Evaluation  Patient identified by MRN, date of birth, ID band Patient awake    Reviewed: Allergy & Precautions, NPO status , Patient's Chart, lab work & pertinent test results  History of Anesthesia Complications Negative for: history of anesthetic complications  Airway Mallampati: III  TM Distance: >3 FB Neck ROM: Full    Dental  (+) Edentulous Upper, Edentulous Lower   Pulmonary shortness of breath, COPD,  COPD inhaler, Current Smoker,     + wheezing      Cardiovascular hypertension, Pt. on medications + angina (-) Past MI and (-) CHF + dysrhythmias  Rhythm:Regular  Frequent pvcs   Neuro/Psych negative neurological ROS  negative psych ROS   GI/Hepatic PUD, GERD  Medicated and Controlled,Symptomatic cholelithiasis    Endo/Other  Morbid obesity  Renal/GU Renal disease     Musculoskeletal   Abdominal   Peds  Hematology  (+) Blood dyscrasia, ,   Anesthesia Other Findings   Reproductive/Obstetrics                           Anesthesia Physical Anesthesia Plan  ASA: III  Anesthesia Plan: General   Post-op Pain Management:    Induction: Intravenous  Airway Management Planned: Oral ETT  Additional Equipment: None  Intra-op Plan:   Post-operative Plan: Extubation in OR and Possible Post-op intubation/ventilation  Informed Consent: I have reviewed the patients History and Physical, chart, labs and discussed the procedure including the risks, benefits and alternatives for the proposed anesthesia with the patient or authorized representative who has indicated his/her understanding and acceptance.   Dental advisory given  Plan Discussed with: CRNA and Surgeon  Anesthesia Plan Comments:        Anesthesia Quick Evaluation

## 2015-09-23 NOTE — Transfer of Care (Signed)
Immediate Anesthesia Transfer of Care Note  Patient: Cody [name]  Procedure(s) Performed: Procedure(s): LAPAROSCOPIC CHOLECYSTECTOMY WITH INTRAOPERATIVE CHOLANGIOGRAM (N/A)  Patient Location: PACU  Anesthesia Type:General  Level of Consciousness:  sedated, patient cooperative and responds to stimulation  Airway & Oxygen Therapy:Patient Spontanous Breathing and Patient connected to face mask oxgen  Post-op Assessment:  Report given to PACU RN and Post -op Vital signs reviewed and stable  Post vital signs:  Reviewed and stable  Last Vitals:  Filed Vitals:   09/23/15 1215  BP: 116/84  Pulse: 83  Temp: 36.9 C  Resp: 18    Complications: No apparent anesthesia complications

## 2015-09-23 NOTE — Anesthesia Postprocedure Evaluation (Signed)
Anesthesia Post Note  Patient: Cody [name]  Procedure(s) Performed: Procedure(s) (LRB): LAPAROSCOPIC CHOLECYSTECTOMY WITH INTRAOPERATIVE CHOLANGIOGRAM (N/A)  Patient location during evaluation: PACU Anesthesia Type: General Level of consciousness: awake Pain management: pain level controlled Vital Signs Assessment: post-procedure vital signs reviewed and stable Respiratory status: spontaneous breathing Cardiovascular status: stable Postop Assessment: no signs of nausea or vomiting Anesthetic complications: no    Last Vitals:  Filed Vitals:   09/23/15 1615 09/23/15 1630  BP: 118/78 118/78  Pulse: 73 77  Temp:  36.7 C  Resp: 15 20    Last Pain:  Filed Vitals:   09/23/15 1633  PainSc: 4                  Brentyn Seehafer

## 2015-09-23 NOTE — Anesthesia Procedure Notes (Signed)
Procedure Name: Intubation Date/Time: 09/23/2015 1:47 PM Performed by: West Pugh Pre-anesthesia Checklist: Patient identified, Emergency Drugs available, Suction available, Patient being monitored and Timeout performed Patient Re-evaluated:Patient Re-evaluated prior to inductionOxygen Delivery Method: Circle system utilized Preoxygenation: Pre-oxygenation with 100% oxygen Intubation Type: IV induction Ventilation: Mask ventilation without difficulty Laryngoscope Size: Mac and 4 Grade View: Grade I Tube type: Oral Tube size: 7.5 mm Number of attempts: 1 Airway Equipment and Method: Stylet Placement Confirmation: ETT inserted through vocal cords under direct vision,  positive ETCO2,  CO2 detector and breath sounds checked- equal and bilateral Secured at: 22 cm Tube secured with: Tape Dental Injury: Teeth and Oropharynx as per pre-operative assessment

## 2015-09-23 NOTE — H&P (Signed)
Cody [name] 09/13/2015 3:00 PM Location: [facility] Patient #: [id number] DOB: Jul 09, 1965 Single / Language: Cody [name] / Race: White Male   History of Present Illness [name] M. Bryce Kimble MD; 09/23/2015 9:17 AM) The patient is a 50 year old male who presents for evaluation of gallbladder disease. He is referred by Dr Juanita Craver for evaluation of RUQ pain. he states he has been having intermittent right-sided pain for months. It is occurring more frequently. It generally occurs after eating. He will also have nausea. Lately it has become more of a constant sensation and it will get worse at certain times. He denies any fever, chills, vomiting, melena, hematochezia. He does have a history of alcohol abuse and had stopped drinking a few months ago but because of the pain he has resumed drinking on a daily basis. He also smokes about a pack a day. He denies any NSAID use. He had an upper endoscopy in fall 2016 which showed erosive gastritis and mild duodenitis. He had a u/s last year that showed no cirrhotic changes on imaging. he had an abdominal ultrasound in June that showed gallstones and fatty liver. He denies any weight loss. The pain will get better if he takes pain pills. He does have some dyspnea on exertion but no orthopnea or paroxysmal nocturnal dyspnea. Denies TIAs and amaurosis fugax. recent hepatitis panel was negative. recent LFTs were normal.   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 Omeprazole (20MG  Capsule DR, Oral) Active. AmLODIPine Besylate (5MG   Tablet, Oral) Active. Medications Reconciled  Vitals ([name] CMA; 09/13/2015 3:02 PM) 09/13/2015 3:02 PM Weight: 219 lb Height: 70in Body Surface Area: 2.17 m Body Mass Index: 31.42 kg/m  Temp.: 97.53F(Temporal)  Pulse: 76 (Regular)  BP: 124/80 (Sitting, Left Arm, Standard)       Physical Exam [name] M. [name] MD; 09/23/2015 7:35 AM) General Mental Status-Alert. General Appearance-Consistent with stated age. Hydration-Well hydrated. Voice-Normal. Note: obese, slightly dishoveled.   Head and Neck Head-normocephalic, atraumatic with no lesions or palpable masses. Trachea-midline. Thyroid Gland Characteristics - normal size and consistency.  Eye Eyeball - Bilateral-Extraocular movements intact. Sclera/Conjunctiva - Bilateral-No scleral icterus.  Chest and Lung Exam Chest and lung exam reveals -quiet, even and easy respiratory effort with no use of accessory muscles and on auscultation, normal breath sounds, no adventitious sounds and normal vocal resonance. Inspection Chest Wall - Normal. Back - normal.  Breast - Did not examine.  Cardiovascular Cardiovascular examination reveals -normal heart sounds, regular rate and rhythm with no murmurs and normal pedal pulses bilaterally.  Abdomen Inspection Inspection of the abdomen reveals - No Hernias. Skin - Scar - no surgical scars. Palpation/Percussion Palpation and Percussion of the abdomen reveal - Soft, Non Tender, No Rebound tenderness, No Rigidity (guarding) and No hepatosplenomegaly. Auscultation Auscultation of the abdomen reveals - Bowel sounds normal.  Peripheral Vascular Upper Extremity Palpation - Pulses bilaterally normal.  Neurologic Neurologic evaluation reveals -alert and oriented x 3 with no impairment of recent or remote memory. Mental Status-Normal.  Neuropsychiatric The patient's mood and affect are described as -normal. Judgment and Insight-insight is  appropriate concerning matters relevant to self.  Musculoskeletal Normal Exam - Left-Upper Extremity Strength Normal and Lower  Extremity Strength Normal. Normal Exam - Right-Upper Extremity Strength Normal and Lower Extremity Strength Normal.  Lymphatic Head & Neck  General Head & Neck Lymphatics: Bilateral - Description - Normal. Axillary - Did not examine. Femoral & Inguinal - Did not examine.    Assessment & Plan [name] M. Janise Gora MD; 09/23/2015 9:19 AM) SYMPTOMATIC CHOLELITHIASIS (K80.20) Impression: I believe most of the patient's symptoms are consistent with gallbladder disease.  We discussed gallbladder disease. The patient was given Neurosurgeon. We discussed non-operative and operative management. We discussed the signs & symptoms of acute cholecystitis  I discussed laparoscopic cholecystectomy with IOC in detail. The patient was given educational material as well as diagrams detailing the procedure. We discussed the risks and benefits of a laparoscopic cholecystectomy including, but not limited to bleeding, infection, injury to surrounding structures such as the intestine or liver, bile leak, retained gallstones, need to convert to an open procedure, prolonged diarrhea, blood clots such as DVT, common bile duct injury, anesthesia risks, and possible need for additional procedures. We discussed the typical post-operative recovery course. I explained that the likelihood of improvement of their symptoms is fair-good. we did discuss that his tobacco use and alcohol use increases his risk of complications.  The patient has elected to proceed with surgery. Current Plans Pt Education - Pamphlet Given - Laparoscopic Gallbladder Surgery: discussed with patient and provided information. You are being scheduled for surgery - Our schedulers will call you.  You should hear from our office's scheduling department within 5 working days about the location, date, and time of surgery. We  try to make accommodations for patient's preferences in scheduling surgery, but sometimes the OR schedule or the surgeon's schedule prevents [place] from making those accommodations.  If you have not heard from our office [phone]) in 5 working days, call the office and ask for your surgeon's nurse.  If you have other questions about your diagnosis, plan, or surgery, call the office and ask for your surgeon's nurse.  ALCOHOL USE DISORDER (F10.99) Impression: we discussed his alcohol use. I dont think his pain is due to gastritis or duodenitis necessarily. he had stopped drinking when this RUQ pain started. He only started drinking again he said he couldn't find relief for this RUQ pain. Current Plans Pt Education - [facility] Free Text Education/Instructions: discussed with patient and provided information. 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 Redmond Pulling, MD, FACS General, Bariatric, & Minimally Invasive Surgery [facility], [place]

## 2015-09-24 DIAGNOSIS — K8010 Calculus of gallbladder with chronic cholecystitis without obstruction: Secondary | ICD-10-CM | POA: Diagnosis present

## 2015-09-24 DIAGNOSIS — K8012 Calculus of gallbladder with acute and chronic cholecystitis without obstruction: Secondary | ICD-10-CM | POA: Diagnosis not present

## 2015-09-24 LAB — CBC
HCT: 47.2 % (ref 39.0–52.0)
Hemoglobin: 15.7 g/dL (ref 13.0–17.0)
MCH: 36.2 pg — ABNORMAL HIGH (ref 26.0–34.0)
MCHC: 33.3 g/dL (ref 30.0–36.0)
MCV: 108.8 fL — ABNORMAL HIGH (ref 78.0–100.0)
PLATELETS: 221 10*3/uL (ref 150–400)
RBC: 4.34 MIL/uL (ref 4.22–5.81)
RDW: 13.8 % (ref 11.5–15.5)
WBC: 14.9 10*3/uL — AB (ref 4.0–10.5)

## 2015-09-24 LAB — BASIC METABOLIC PANEL
Anion gap: 5 (ref 5–15)
BUN: 13 mg/dL (ref 6–20)
CO2: 27 mmol/L (ref 22–32)
CREATININE: 0.87 mg/dL (ref 0.61–1.24)
Calcium: 8.4 mg/dL — ABNORMAL LOW (ref 8.9–10.3)
Chloride: 104 mmol/L (ref 101–111)
Glucose, Bld: 172 mg/dL — ABNORMAL HIGH (ref 65–99)
POTASSIUM: 4.4 mmol/L (ref 3.5–5.1)
SODIUM: 136 mmol/L (ref 135–145)

## 2015-09-24 MED ORDER — NICOTINE 21 MG/24HR TD PT24: 21.0000 mg | MEDICATED_PATCH | Freq: Every day | TRANSDERMAL | Status: DC

## 2015-09-24 MED ORDER — OXYCODONE HCL 5 MG PO TABS: 5.0000 mg | ORAL_TABLET | ORAL | Status: DC | PRN

## 2015-09-24 NOTE — Discharge Summary (Signed)
Physician Discharge Summary  Cody [name] [id number] DOB: 1965/08/24 DOA: 09/23/2015  PCP: Stephens Shire, MD  Admit date: 09/23/2015 Discharge date: 09/24/2015  Recommendations for Outpatient Follow-up:   Follow-up Information    Follow up with Cody Curry, MD. Schedule an appointment as soon as possible for a visit in 3 weeks.   Specialty:  General Surgery   Why:  For wound re-check   Contact information:   [address] [place] [address] [phone]      Discharge Diagnoses:  Principal Problem:   Chronic cholecystitis with calculus Active Problems:   COPD (chronic obstructive pulmonary disease) ([place])   Alcohol abuse   Tobacco abuse   Hypertension   S/P laparoscopic cholecystectomy    Surgical Procedure: laparoscopic cholecystectomy with cholangiogram 7/17  Discharge Condition: good Disposition: home  Diet recommendation: regular  Filed Weights   09/23/15 1215  Weight: 98.884 kg (218 lb)    Hospital Course:  The patient was kept overnight for observation given his COPD. He did well. On POD 1 he was tolerating a diet, his pain was well controlled, he was ambulating, and his breathing was at his baseline. We discussed dc instructions. He requested rx for nicotine patches bc he is interested in stopping smoking and cutting back on his drinking.   BP 93/51 mmHg  Pulse 51  Temp(Src) 97.8 F (36.6 C) (Oral)  Resp 16  Ht 5\' 8"  (1.727 m)  Wt 98.884 kg (218 lb)  BMI 33.15 kg/m2  SpO2 94%  Gen: alert, NAD, non-toxic appearing Pupils: equal, no scleral icterus Pulm: Lungs fairly some mild wheezing, symmetric chest rise CV: regular rate and rhythm Abd: soft, approp tender, nondistended. Obese. Some bruising at umbilicus.  No cellulitis. No incisional hernia Ext: no edema, no calf tenderness Skin: no rash, no jaundice    Discharge Instructions  Discharge Instructions    Call MD for:  hives    Complete by:  As directed      Call MD for:   persistant dizziness or light-headedness    Complete by:  As directed      Call MD for:  persistant nausea and vomiting    Complete by:  As directed      Call MD for:  redness, tenderness, or signs of infection (pain, swelling, redness, odor or green/yellow discharge around incision site)    Complete by:  As directed      Call MD for:  severe uncontrolled pain    Complete by:  As directed      Call MD for:    Complete by:  As directed   Temperature >101     Diet general    Complete by:  As directed      Discharge instructions    Complete by:  As directed   See [facility] discharge instructions     Increase activity slowly    Complete by:  As directed             Medication List    STOP taking these medications        HYDROcodone-acetaminophen 5-325 MG tablet  Commonly known as:  NORCO/VICODIN      TAKE these medications        acetaminophen 500 MG tablet  Commonly known as:  TYLENOL  Take 1,000 mg by mouth every 6 (six) hours as needed for moderate pain or headache.     losartan-hydrochlorothiazide 100-12.5 MG tablet  Commonly known as:  HYZAAR  Take 1  tablet by mouth daily.     nicotine 21 mg/24hr patch  Commonly known as:  NICODERM CQ - dosed in mg/24 hours  Place 1 patch (21 mg total) onto the skin daily.     omeprazole 20 MG capsule  Commonly known as:  PRILOSEC  1 PO 30 mins prior to breakfast and supper     oxyCODONE 5 MG immediate release tablet  Commonly known as:  Oxy IR/ROXICODONE  Take 1-2 tablets (5-10 mg total) by mouth every 4 (four) hours as needed for moderate pain.           Follow-up Information    Follow up with Cody Curry, MD. Schedule an appointment as soon as possible for a visit in 3 weeks.   Specialty:  General Surgery   Why:  For wound re-check   Contact information:   [place] [address] [phone]        The results of significant diagnostics from this hospitalization (including imaging,  microbiology, ancillary and laboratory) are listed below for reference.    Significant Diagnostic Studies: Dg Cholangiogram Operative  09/23/2015  CLINICAL DATA:  Cholelithiasis EXAM: INTRAOPERATIVE CHOLANGIOGRAM TECHNIQUE: Cholangiographic images from the C-arm fluoroscopic device were submitted for interpretation post-operatively. Please see the procedural report for the amount of contrast and the fluoroscopy time utilized. COMPARISON:  None. FINDINGS: Contrast fills the biliary tree and duodenum without filling defects in the common bile duct to suggest common duct stones. IMPRESSION: Patent biliary tree. Electronically Signed   By: Marybelle Killings M.D.   On: 09/23/2015 15:13   US Abdomen Limited Ruq  08/30/2015  CLINICAL DATA:  Right upper quadrant pain. EXAM: US ABDOMEN LIMITED - RIGHT UPPER QUADRANT COMPARISON:  10/25/2014. FINDINGS: Gallbladder: Multiple gallstones. Gallbladder wall thickness normal at 1.8 mm. No pericholecystic fluid collection. Negative Murphy sign. Common bile duct: Diameter: 4.1 mm Liver: The liver is echogenic consistent with fatty infiltration and/or hepatocellular disease. IMPRESSION: 1. Multiple gallstones again noted. 2. Liver is again noted to be echogenic consistent with fatty infiltration and hepatocellular disease. Electronically Signed   By: Marcello Moores  Register   On: 08/30/2015 09:15    Microbiology: No results found for this or any previous visit (from the past 240 hour(s)).   Labs: Basic Metabolic Panel:  Recent Labs Lab 09/23/15 1248 09/24/15 0424  NA 140 136  K 3.8 4.4  CL 103 104  CO2 28 27  GLUCOSE 103* 172*  BUN 13 13  CREATININE 0.90 0.87  CALCIUM 9.2 8.4*   Liver Function Tests:  Recent Labs Lab 09/23/15 1248  AST 16  ALT 12*  ALKPHOS 64  BILITOT 1.2  PROT 6.4*  ALBUMIN 3.3*   No results for input(s): LIPASE, AMYLASE in the last 168 hours. No results for input(s): AMMONIA in the last 168 hours. CBC:  Recent Labs Lab 09/23/15 1248  09/24/15 0424  WBC 10.5 14.9*  NEUTROABS 7.8*  --   HGB 17.8* 15.7  HCT 52.3* 47.2  MCV 110.1* 108.8*  PLT 242 221   Cardiac Enzymes: No results for input(s): CKTOTAL, CKMB, CKMBINDEX, TROPONINI in the last 168 hours. BNP: BNP (last 3 results) No results for input(s): BNP in the last 8760 hours.  ProBNP (last 3 results) No results for input(s): PROBNP in the last 8760 hours.  CBG: No results for input(s): GLUCAP in the last 168 hours.  Active Problems:   S/P laparoscopic cholecystectomy   Time coordinating discharge: 15 min  Signed:  [name]  Ronnie Derby, MD [facility], [place] [phone] 09/24/2015, 9:24 AM

## 2015-09-24 NOTE — Discharge Instructions (Signed)
[facility]: POST OP INSTRUCTIONS Always review your discharge instruction sheet given to you by the facility where your surgery was performed. IF YOU HAVE DISABILITY OR FAMILY LEAVE FORMS, YOU MUST BRING THEM TO THE OFFICE FOR PROCESSING.   DO NOT GIVE THEM TO YOUR DOCTOR.  1. A prescription for pain medication may be given to you upon discharge.  Take your pain medication as prescribed, if needed.  If narcotic pain medicine is not needed, then you may take acetaminophen (Tylenol) or ibuprofen (Advil) as needed. 2. Take your usually prescribed medications unless otherwise directed. 3. If you need a refill on your pain medication, please contact your pharmacy.  They will contact our office to request authorization. Prescriptions will not be filled after 5pm or on week-ends. 4. You should follow a light diet the first few days after arrival home, such as soup and crackers, etc.  Be sure to include lots of fluids daily. 5. Most patients will experience some swelling and bruising in the area of the incisions.  Ice packs will help.  Swelling and bruising can take several days to resolve.  6. It is common to experience some constipation if taking pain medication after surgery.  Increasing fluid intake and taking a stool softener (such as Colace) will usually help or prevent this problem from occurring.  A mild laxative (Milk of Magnesia or Miralax) should be taken according to package instructions if there are no bowel movements after 48 hours. 7. Unless discharge instructions indicate otherwise, you may remove your bandages 48 hours after surgery, and you may shower at that time.  You  have steri-strips (small skin tapes) in place directly over the incision.  These strips should be left on the skin for 7-10 days.  8. ACTIVITIES:  You may resume regular (light) daily activities beginning the next day--such as daily self-care, walking, climbing stairs--gradually  increasing activities as tolerated.  You may have sexual intercourse when it is comfortable.  Refrain from any heavy lifting or straining until approved by your doctor. a. You may drive when you are no longer taking prescription pain medication, you can comfortably wear a seatbelt, and you can safely maneuver your car and apply brakes. 9. You should see your doctor in the office for a follow-up appointment approximately 2-3 weeks after your surgery.  Make sure that you call for this appointment within a day or two after you arrive home to insure a convenient appointment time. 10. OTHER INSTRUCTIONS:DO NOT LIFT, PUSH, OR PULL ANYTHING GREATER THAN 10LBS FOR 2 WEEKS  WHEN TO CALL YOUR DOCTOR: 1. Fever over 101.0 2. Inability to urinate 3. Continued bleeding from incision. 4. Increased pain, redness, or drainage from the incision. 5. Increasing abdominal pain  The clinic staff is available to answer your questions during regular business hours.  Please dont hesitate to call and ask to speak to one of the nurses for clinical concerns.  If you have a medical emergency, go to the nearest emergency room or call 911.  A surgeon from [facility] is always on call at the hospital. [address] [place], [place], [place]  [address] ? [address], [place], [place]   [address] [phone] ? [phone] ? FAX [phone] Web site: [email or web address]  Nicotine skin patches What is this medicine? NICOTINE ([place]) helps people stop smoking. The patches replace the nicotine found in cigarettes and help to decrease withdrawal effects. They are most effective when  used in combination with a stop-smoking program. This medicine may be used for other purposes; ask your health care provider or pharmacist if you have questions. What should I tell my health care provider before I take this medicine? They need to know if you have any of these conditions: -diabetes -heart  disease, angina, irregular heartbeat or previous heart attack -high blood pressure -lung disease, including asthma -overactive thyroid -pheochromocytoma -seizures or a history of seizures -skin problems, like eczema -stomach problems or ulcers -an unusual or allergic reaction to nicotine, adhesives, other medicines, foods, dyes, or preservatives -pregnant or trying to get pregnant -breast-feeding How should I use this medicine? This medicine is for use on the skin. Follow the directions that come with the patches. Find an area of skin on your upper arm, chest, or back that is clean, dry, greaseless, undamaged and hairless. Wash hands with plain soap and water. Do not use anything that contains aloe, lanolin or glycerin as these may prevent the patch from sticking. Dry thoroughly. Remove the patch from the sealed pouch. Do not try to cut or trim the patch. Using your palm, press the patch firmly in place for 10 seconds to make sure that there is good contact with your skin. After applying the patch, wash your hands. Change the patch every day, keeping to a regular schedule. When you apply a new patch, use a new area of skin. Wait at least 1 week before using the same area again. Talk to your pediatrician regarding the use of this medicine in children. Special care may be needed. Overdosage: If you think you have taken too much of this medicine contact a poison control center or emergency room at once. NOTE: This medicine is only for you. Do not share this medicine with others. What if I miss a dose? If you forget to replace a patch, use it as soon as you can. Only use one patch at a time and do not leave on the skin for longer than directed. If a patch falls off, you can replace it, but keep to your schedule and remove the patch at the right time. What may interact with this medicine? -medicines for asthma -medicines for blood pressure -medicines for mental depression This list may not describe  all possible interactions. Give your health care provider a list of all the medicines, herbs, non-prescription drugs, or dietary supplements you use. Also tell them if you smoke, drink alcohol, or use illegal drugs. Some items may interact with your medicine. What should I watch for while using this medicine? You should begin using the nicotine patch the day you stop smoking. It is okay if you do not succeed at your attempt to quit and have a cigarette. You can still continue your quit attempt and keep using the product as directed. Just throw away your cigarettes and get back to your quit plan. You can keep the patch in place during swimming, bathing, and showering. If your patch falls off during these activities, replace it. When you first apply the patch, your skin may itch or burn. This should go away soon. When you remove a patch, the skin may look red, but this should only last for a few days. Call your doctor or health care professional if skin redness does not go away after 4 days, if your skin swells, or if you get a rash. If you are a diabetic and you quit smoking, the effects of insulin may be increased and you may need  to reduce your insulin dose. Check with your doctor or health care professional about how you should adjust your insulin dose. If you are going to have a magnetic resonance imaging (MRI) procedure, tell your MRI technician if you have this patch on your body. It must be removed before a MRI. What side effects may I notice from receiving this medicine? Side effects that you should report to your doctor or health care professional as soon as possible: -allergic reactions like skin rash, itching or hives, swelling of the face, lips, or tongue -breathing problems -changes in hearing -changes in vision -chest pain -cold sweats -confusion -fast, irregular heartbeat -feeling faint or lightheaded, falls -headache -increased saliva -skin redness that lasts more than 4  days -stomach pain -signs and symptoms of nicotine overdose like nausea; vomiting; dizziness; weakness; and rapid heartbeat Side effects that usually do not require medical attention (report to your doctor or health care professional if they continue or are bothersome): -diarrhea -dry mouth -hiccups -irritability -nervousness or restlessness -trouble sleeping or vivid dreams This list may not describe all possible side effects. Call your doctor for medical advice about side effects. You may report side effects to FDA at 1-800-FDA-1088. Where should I keep my medicine? Keep out of the reach of children. Store at room temperature between 20 and 25 degrees C (68 and 77 degrees F). Protect from heat and light. Store in International aid/development worker until ready to use. Throw away unused medicine after the expiration date. When you remove a patch, fold with sticky sides together; put in an empty opened pouch and throw away. NOTE: This sheet is a summary. It may not cover all possible information. If you have questions about this medicine, talk to your doctor, pharmacist, or health care provider.    2016, [employer]. (2014-01-22 15:46:21)

## 2015-09-24 NOTE — Progress Notes (Signed)
Discharge instructions discussed with patient, verbalized understanding and agreement, prescriptions given to patient 

## 2016-02-18 ENCOUNTER — Telehealth: Payer: Self-pay | Admitting: Nurse Practitioner

## 2016-02-18 ENCOUNTER — Encounter: Payer: Self-pay | Admitting: Nurse Practitioner

## 2016-02-18 ENCOUNTER — Ambulatory Visit: Payer: BLUE CROSS/BLUE SHIELD | Admitting: Nurse Practitioner

## 2016-02-18 NOTE — Telephone Encounter (Signed)
PT WAS A NO SHOW AND LETTER SENT  °

## 2016-02-19 NOTE — Telephone Encounter (Signed)
Noted  

## 2016-06-12 IMAGING — CR DG CHEST 1V PORT
1 series · 1 of 1 positions shown · non-contrast
Comparison: 01/11/2014

CLINICAL DATA: Centralized chest pain for 1 week. Difficulty
swallowing.

EXAM:
PORTABLE CHEST - 1 VIEW

[ap portable]
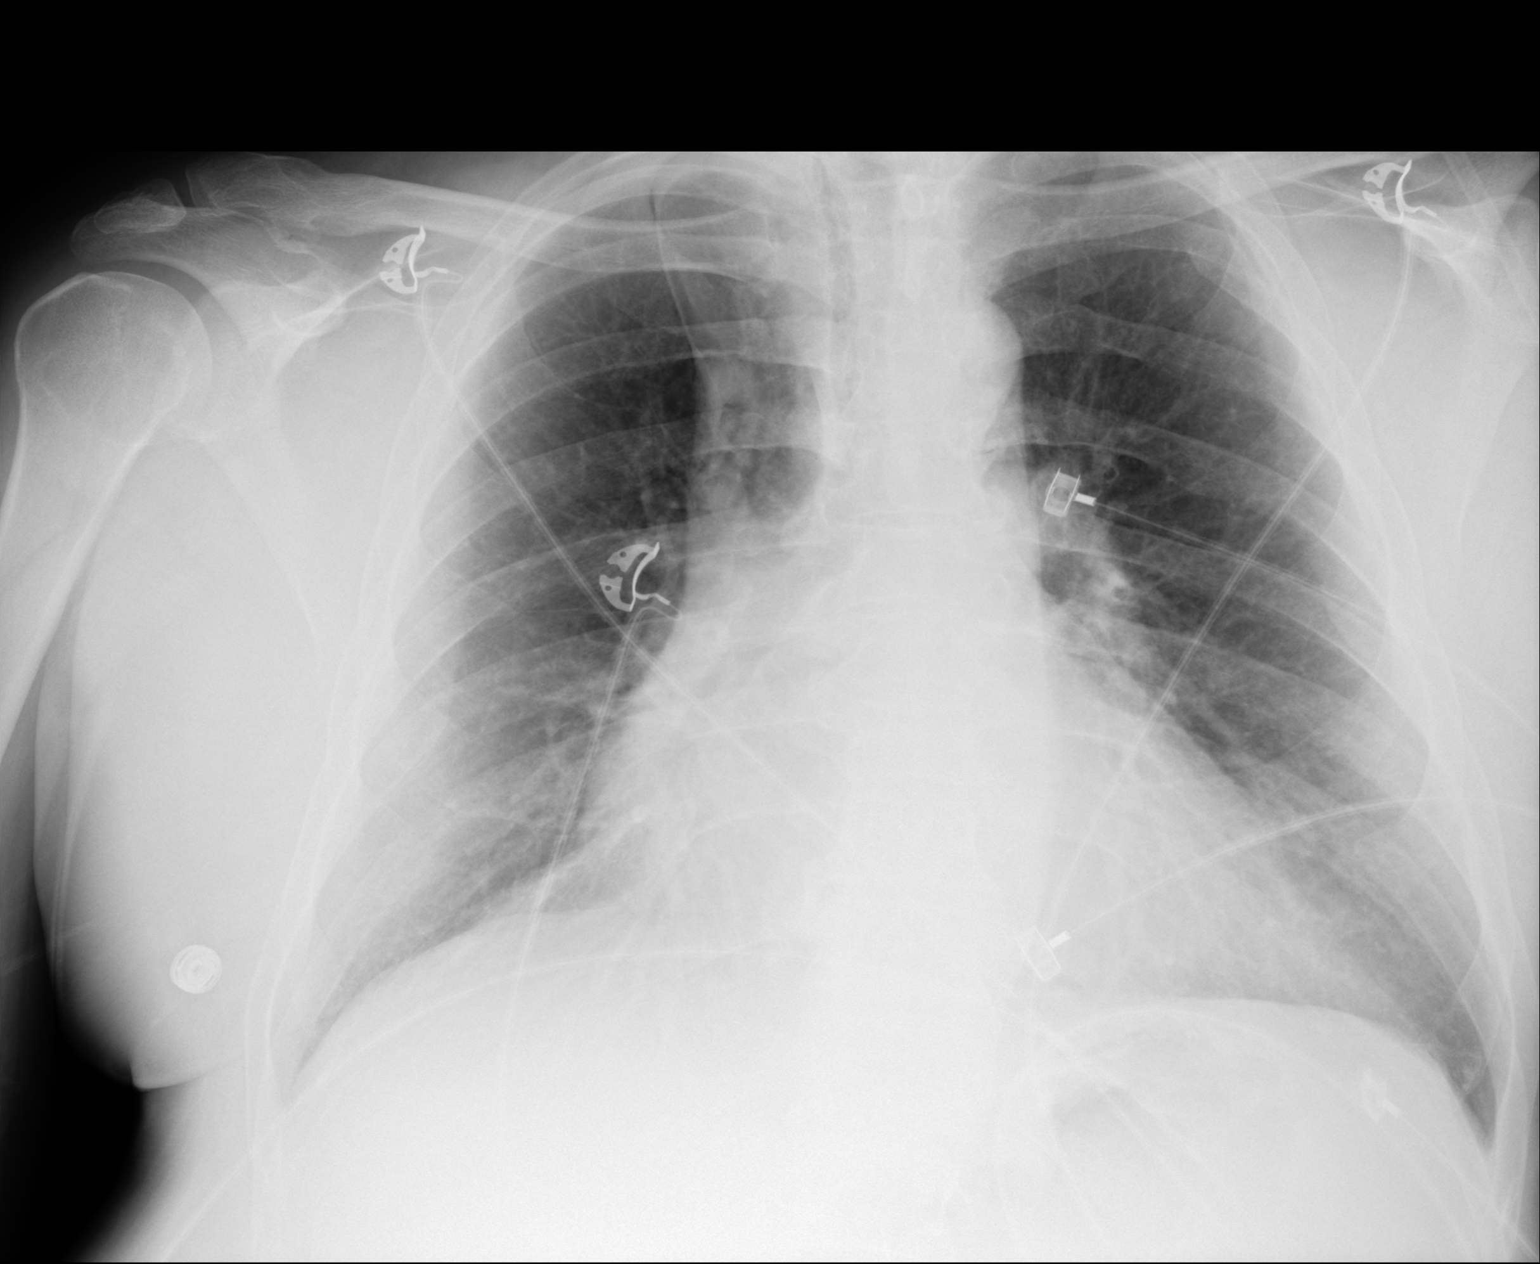

[1 of 1 positions shown; findings below may reference images not displayed]

FINDINGS: Stable cardiomegaly without edema, definite pneumonia, collapse or
consolidation. No effusion pneumothorax. Azygos fissure in the right
upper lobe noted, normal variant. No acute osseous finding. Healed
upper left rib fractures.
IMPRESSION: Cardiomegaly without acute process.

## 2016-07-28 ENCOUNTER — Encounter (HOSPITAL_COMMUNITY): Payer: Self-pay | Admitting: Emergency Medicine

## 2016-07-28 ENCOUNTER — Emergency Department (HOSPITAL_COMMUNITY)
Admission: EM | Admit: 2016-07-28 | Discharge: 2016-07-28 | Disposition: A | Discharge status: Home / Self Care | Payer: Medicaid Other | Attending: Emergency Medicine | Admitting: Emergency Medicine

## 2016-07-28 ENCOUNTER — Emergency Department (HOSPITAL_COMMUNITY): Payer: Medicaid Other

## 2016-07-28 DIAGNOSIS — J449 Chronic obstructive pulmonary disease, unspecified: Secondary | ICD-10-CM | POA: Insufficient documentation

## 2016-07-28 DIAGNOSIS — K529 Noninfective gastroenteritis and colitis, unspecified: Secondary | ICD-10-CM | POA: Insufficient documentation

## 2016-07-28 DIAGNOSIS — F17210 Nicotine dependence, cigarettes, uncomplicated: Secondary | ICD-10-CM | POA: Diagnosis not present

## 2016-07-28 DIAGNOSIS — Z79899 Other long term (current) drug therapy: Secondary | ICD-10-CM | POA: Insufficient documentation

## 2016-07-28 DIAGNOSIS — R112 Nausea with vomiting, unspecified: Secondary | ICD-10-CM | POA: Diagnosis present

## 2016-07-28 LAB — CBC
HCT: 53.5 % — ABNORMAL HIGH (ref 39.0–52.0)
HEMOGLOBIN: 19.4 g/dL — AB (ref 13.0–17.0)
MCH: 38.6 pg — AB (ref 26.0–34.0)
MCHC: 36.3 g/dL — AB (ref 30.0–36.0)
MCV: 106.6 fL — AB (ref 78.0–100.0)
Platelets: 309 10*3/uL (ref 150–400)
RBC: 5.02 MIL/uL (ref 4.22–5.81)
RDW: 13.2 % (ref 11.5–15.5)
WBC: 13.2 10*3/uL — ABNORMAL HIGH (ref 4.0–10.5)

## 2016-07-28 LAB — COMPREHENSIVE METABOLIC PANEL
ALBUMIN: 3.8 g/dL (ref 3.5–5.0)
ALK PHOS: 77 U/L (ref 38–126)
ALT: 18 U/L (ref 17–63)
ANION GAP: 10 (ref 5–15)
AST: 20 U/L (ref 15–41)
BUN: 10 mg/dL (ref 6–20)
CALCIUM: 9.9 mg/dL (ref 8.9–10.3)
CHLORIDE: 96 mmol/L — AB (ref 101–111)
CO2: 28 mmol/L (ref 22–32)
CREATININE: 1.01 mg/dL (ref 0.61–1.24)
GFR calc Af Amer: >60 mL/min (ref >60)
GFR calc non Af Amer: >60 mL/min (ref >60)
GLUCOSE: 122 mg/dL — AB (ref 65–99)
Potassium: 3.6 mmol/L (ref 3.5–5.1)
SODIUM: 134 mmol/L — AB (ref 135–145)
Total Bilirubin: 0.9 mg/dL (ref 0.3–1.2)
Total Protein: 7.6 g/dL (ref 6.5–8.1)

## 2016-07-28 LAB — URINALYSIS, ROUTINE W REFLEX MICROSCOPIC
BILIRUBIN URINE: NEGATIVE
Glucose, UA: NEGATIVE mg/dL
HGB URINE DIPSTICK: NEGATIVE
Ketones, ur: NEGATIVE mg/dL
Leukocytes, UA: NEGATIVE
Nitrite: NEGATIVE
Protein, ur: NEGATIVE mg/dL
SPECIFIC GRAVITY, URINE: 1.006 (ref 1.005–1.030)
pH: 6 (ref 5.0–8.0)

## 2016-07-28 LAB — LIPASE, BLOOD: Lipase: 16 U/L (ref 11–51)

## 2016-07-28 MED ORDER — ONDANSETRON HCL 8 MG PO TABS: 8.0000 mg | ORAL_TABLET | ORAL | 0 refills | Status: AC | PRN

## 2016-07-28 MED ORDER — ONDANSETRON HCL 4 MG/2ML IJ SOLN
4.0000 mg | Freq: Once | INTRAMUSCULAR | Status: AC
  Administered 2016-07-28: 4 mg via INTRAVENOUS
  Filled 2016-07-28: qty 2

## 2016-07-28 MED ORDER — ONDANSETRON HCL 4 MG/2ML IJ SOLN
INTRAMUSCULAR | Status: AC
  Filled 2016-07-28: qty 2

## 2016-07-28 MED ORDER — HYDROCODONE-ACETAMINOPHEN 5-325 MG PO TABS: 1.0000 | ORAL_TABLET | Freq: Four times a day (QID) | ORAL | 0 refills | Status: AC | PRN

## 2016-07-28 MED ORDER — MORPHINE SULFATE (PF) 4 MG/ML IV SOLN
4.0000 mg | Freq: Once | INTRAVENOUS | Status: AC
  Administered 2016-07-28: 4 mg via INTRAVENOUS
  Filled 2016-07-28: qty 1

## 2016-07-28 MED ORDER — METRONIDAZOLE 500 MG PO TABS: 500.0000 mg | ORAL_TABLET | Freq: Three times a day (TID) | ORAL | 0 refills | Status: AC

## 2016-07-28 MED ORDER — SODIUM CHLORIDE 0.9 % IV BOLUS (SEPSIS)
1000.0000 mL | Freq: Once | INTRAVENOUS | Status: AC
  Administered 2016-07-28: 1000 mL via INTRAVENOUS

## 2016-07-28 MED ORDER — IOPAMIDOL (ISOVUE-300) INJECTION 61%
100.0000 mL | Freq: Once | INTRAVENOUS | Status: AC | PRN
  Administered 2016-07-28: 100 mL via INTRAVENOUS

## 2016-07-28 NOTE — Discharge Instructions (Signed)
Flagyl as prescribed.  Hydrocodone as prescribed as needed for pain. Zofran as prescribed as needed for nausea.  Follow-up with your primary Dr. if not improving in the next 3-4 days, and return to the emergency department if you develop worsening pain, high fevers, bloody stools, or other new and concerning symptoms.

## 2016-07-28 NOTE — ED Triage Notes (Signed)
Patient complains of nausea and vomiting x 1 week. States diarrhea and is unable to hold food and water.
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
## 2016-08-04 ENCOUNTER — Inpatient Hospital Stay (HOSPITAL_COMMUNITY)
Admission: EM | Admit: 2016-08-04 | Discharge: 2016-08-07 | DRG: 853 | Disposition: E | Payer: Medicaid Other | Attending: Internal Medicine | Admitting: Internal Medicine

## 2016-08-04 ENCOUNTER — Encounter (HOSPITAL_COMMUNITY): Payer: Self-pay | Admitting: *Deleted

## 2016-08-04 ENCOUNTER — Inpatient Hospital Stay (HOSPITAL_COMMUNITY): Payer: Medicaid Other

## 2016-08-04 ENCOUNTER — Inpatient Hospital Stay (HOSPITAL_COMMUNITY): Payer: Medicaid Other | Admitting: Certified Registered Nurse Anesthetist

## 2016-08-04 ENCOUNTER — Encounter (HOSPITAL_COMMUNITY): Admission: EM | Disposition: E | Payer: Self-pay | Source: Home / Self Care | Attending: Internal Medicine

## 2016-08-04 ENCOUNTER — Emergency Department (HOSPITAL_COMMUNITY): Payer: Medicaid Other

## 2016-08-04 DIAGNOSIS — A419 Sepsis, unspecified organism: Secondary | ICD-10-CM | POA: Diagnosis present

## 2016-08-04 DIAGNOSIS — J9600 Acute respiratory failure, unspecified whether with hypoxia or hypercapnia: Secondary | ICD-10-CM

## 2016-08-04 DIAGNOSIS — K55059 Acute (reversible) ischemia of intestine, part and extent unspecified: Secondary | ICD-10-CM

## 2016-08-04 DIAGNOSIS — R571 Hypovolemic shock: Secondary | ICD-10-CM | POA: Diagnosis present

## 2016-08-04 DIAGNOSIS — J449 Chronic obstructive pulmonary disease, unspecified: Secondary | ICD-10-CM | POA: Diagnosis present

## 2016-08-04 DIAGNOSIS — N179 Acute kidney failure, unspecified: Secondary | ICD-10-CM | POA: Diagnosis present

## 2016-08-04 DIAGNOSIS — Z833 Family history of diabetes mellitus: Secondary | ICD-10-CM

## 2016-08-04 DIAGNOSIS — I745 Embolism and thrombosis of iliac artery: Secondary | ICD-10-CM | POA: Diagnosis present

## 2016-08-04 DIAGNOSIS — Z419 Encounter for procedure for purposes other than remedying health state, unspecified: Secondary | ICD-10-CM

## 2016-08-04 DIAGNOSIS — E872 Acidosis, unspecified: Secondary | ICD-10-CM

## 2016-08-04 DIAGNOSIS — I10 Essential (primary) hypertension: Secondary | ICD-10-CM | POA: Diagnosis present

## 2016-08-04 DIAGNOSIS — K55069 Acute infarction of intestine, part and extent unspecified: Secondary | ICD-10-CM | POA: Diagnosis present

## 2016-08-04 DIAGNOSIS — Z515 Encounter for palliative care: Secondary | ICD-10-CM | POA: Diagnosis present

## 2016-08-04 DIAGNOSIS — D539 Nutritional anemia, unspecified: Secondary | ICD-10-CM | POA: Diagnosis present

## 2016-08-04 DIAGNOSIS — K7460 Unspecified cirrhosis of liver: Secondary | ICD-10-CM | POA: Diagnosis present

## 2016-08-04 DIAGNOSIS — E871 Hypo-osmolality and hyponatremia: Secondary | ICD-10-CM | POA: Diagnosis present

## 2016-08-04 DIAGNOSIS — F17210 Nicotine dependence, cigarettes, uncomplicated: Secondary | ICD-10-CM | POA: Diagnosis present

## 2016-08-04 DIAGNOSIS — K219 Gastro-esophageal reflux disease without esophagitis: Secondary | ICD-10-CM | POA: Diagnosis present

## 2016-08-04 DIAGNOSIS — R109 Unspecified abdominal pain: Secondary | ICD-10-CM

## 2016-08-04 DIAGNOSIS — Z66 Do not resuscitate: Secondary | ICD-10-CM | POA: Diagnosis present

## 2016-08-04 DIAGNOSIS — E876 Hypokalemia: Secondary | ICD-10-CM | POA: Diagnosis present

## 2016-08-04 DIAGNOSIS — Z978 Presence of other specified devices: Secondary | ICD-10-CM

## 2016-08-04 HISTORY — PX: LAPAROTOMY: SHX154

## 2016-08-04 HISTORY — PX: MESENTERIC ARTERY BYPASS: SHX5968

## 2016-08-04 LAB — POCT I-STAT 7, (LYTES, BLD GAS, ICA,H+H)
ACID-BASE DEFICIT: 11 mmol/L — AB (ref 0.0–2.0)
ACID-BASE DEFICIT: 7 mmol/L — AB (ref 0.0–2.0)
BICARBONATE: 20.2 mmol/L (ref 20.0–28.0)
Bicarbonate: 19.3 mmol/L — ABNORMAL LOW (ref 20.0–28.0)
CALCIUM ION: 1.35 mmol/L (ref 1.15–1.40)
Calcium, Ion: 1.22 mmol/L (ref 1.15–1.40)
HCT: 41 % (ref 39.0–52.0)
HCT: 39 % (ref 39.0–52.0)
HEMOGLOBIN: 13.9 g/dL (ref 13.0–17.0)
Hemoglobin: 13.3 g/dL (ref 13.0–17.0)
O2 SAT: 94 %
O2 Saturation: 100 %
PH ART: 7.111 — AB (ref 7.350–7.450)
PH ART: 7.283 — AB (ref 7.350–7.450)
PO2 ART: 319 mmHg — AB (ref 83.0–108.0)
Potassium: 3.1 mmol/L — ABNORMAL LOW (ref 3.5–5.1)
Potassium: 3.2 mmol/L — ABNORMAL LOW (ref 3.5–5.1)
SODIUM: 138 mmol/L (ref 135–145)
Sodium: 137 mmol/L (ref 135–145)
TCO2: 21 mmol/L (ref 0–100)
TCO2: 22 mmol/L (ref 0–100)
pCO2 arterial: 60.7 mmHg — ABNORMAL HIGH (ref 32.0–48.0)
pCO2 arterial: 41.8 mmHg (ref 32.0–48.0)
pO2, Arterial: 72 mmHg — ABNORMAL LOW (ref 83.0–108.0)

## 2016-08-04 LAB — CBC
HCT: 53.3 % — ABNORMAL HIGH (ref 39.0–52.0)
Hemoglobin: 18.8 g/dL — ABNORMAL HIGH (ref 13.0–17.0)
MCH: 37.5 pg — ABNORMAL HIGH (ref 26.0–34.0)
MCHC: 35.3 g/dL (ref 30.0–36.0)
MCV: 106.2 fL — ABNORMAL HIGH (ref 78.0–100.0)
Platelets: 363 10*3/uL (ref 150–400)
RBC: 5.02 MIL/uL (ref 4.22–5.81)
RDW: 12.8 % (ref 11.5–15.5)
WBC: 26.9 10*3/uL — AB (ref 4.0–10.5)

## 2016-08-04 LAB — BLOOD GAS, VENOUS
Acid-base deficit: 15 mmol/L — ABNORMAL HIGH (ref 0.0–2.0)
Bicarbonate: 12.9 mmol/L — ABNORMAL LOW (ref 20.0–28.0)
O2 CONTENT: 2 L/min
O2 SAT: 79.4 %
PATIENT TEMPERATURE: 36.4
pCO2, Ven: 29.8 mmHg — ABNORMAL LOW (ref 44.0–60.0)
pH, Ven: 7.207 — ABNORMAL LOW (ref 7.250–7.430)
pO2, Ven: 58.4 mmHg — ABNORMAL HIGH (ref 32.0–45.0)

## 2016-08-04 LAB — I-STAT CG4 LACTIC ACID, ED: LACTIC ACID, VENOUS: 16.37 mmol/L — AB (ref 0.5–1.9)

## 2016-08-04 LAB — URINALYSIS, ROUTINE W REFLEX MICROSCOPIC
Bilirubin Urine: NEGATIVE
Glucose, UA: 50 mg/dL — AB
HGB URINE DIPSTICK: NEGATIVE
Ketones, ur: 5 mg/dL — AB
LEUKOCYTES UA: NEGATIVE
NITRITE: NEGATIVE
Protein, ur: NEGATIVE mg/dL
SPECIFIC GRAVITY, URINE: 1.026 (ref 1.005–1.030)
pH: 5 (ref 5.0–8.0)

## 2016-08-04 LAB — COMPREHENSIVE METABOLIC PANEL
ALBUMIN: 3.3 g/dL — AB (ref 3.5–5.0)
ALK PHOS: 85 U/L (ref 38–126)
ALT: 122 U/L — ABNORMAL HIGH (ref 17–63)
AST: 84 U/L — ABNORMAL HIGH (ref 15–41)
Anion gap: 26 — ABNORMAL HIGH (ref 5–15)
BILIRUBIN TOTAL: 0.6 mg/dL (ref 0.3–1.2)
BUN: 41 mg/dL — ABNORMAL HIGH (ref 6–20)
CALCIUM: 9.5 mg/dL (ref 8.9–10.3)
CO2: 14 mmol/L — ABNORMAL LOW (ref 22–32)
Chloride: 88 mmol/L — ABNORMAL LOW (ref 101–111)
Creatinine, Ser: 1.83 mg/dL — ABNORMAL HIGH (ref 0.61–1.24)
GFR calc Af Amer: 47 mL/min — ABNORMAL LOW (ref >60)
GFR, EST NON AFRICAN AMERICAN: 40 mL/min — AB (ref >60)
GLUCOSE: 247 mg/dL — AB (ref 65–99)
POTASSIUM: 3.4 mmol/L — AB (ref 3.5–5.1)
Sodium: 128 mmol/L — ABNORMAL LOW (ref 135–145)
TOTAL PROTEIN: 7 g/dL (ref 6.5–8.1)

## 2016-08-04 LAB — TYPE AND SCREEN
ABO/RH(D): O POS
ANTIBODY SCREEN: NEGATIVE

## 2016-08-04 LAB — I-STAT TROPONIN, ED: TROPONIN I, POC: 0 ng/mL (ref 0.00–0.08)

## 2016-08-04 LAB — LACTIC ACID, PLASMA
Lactic Acid, Venous: 14.7 mmol/L (ref 0.5–1.9)
Lactic Acid, Venous: 4.7 mmol/L (ref 0.5–1.9)

## 2016-08-04 LAB — SURGICAL PCR SCREEN
MRSA, PCR: NEGATIVE
STAPHYLOCOCCUS AUREUS: NEGATIVE

## 2016-08-04 LAB — PROTIME-INR
INR: 1.41
PROTHROMBIN TIME: 17.4 s — AB (ref 11.4–15.2)

## 2016-08-04 LAB — LIPASE, BLOOD: Lipase: 38 U/L (ref 11–51)

## 2016-08-04 SURGERY — CREATION, BYPASS, ARTERIAL, MESENTERIC
Anesthesia: General | Site: Abdomen

## 2016-08-04 MED ORDER — PROPOFOL 500 MG/50ML IV EMUL
INTRAVENOUS | Status: AC
  Administered 2016-08-04: 10 ug/kg/min
  Filled 2016-08-04: qty 50

## 2016-08-04 MED ORDER — THIAMINE HCL 100 MG/ML IJ SOLN: 100.0000 mg | Freq: Every day | INTRAMUSCULAR | Status: DC

## 2016-08-04 MED ORDER — FENTANYL CITRATE (PF) 100 MCG/2ML IJ SOLN
INTRAMUSCULAR | Status: AC
  Administered 2016-08-04: 200 ug
  Filled 2016-08-04: qty 4

## 2016-08-04 MED ORDER — HEPARIN (PORCINE) IN NACL 100-0.45 UNIT/ML-% IJ SOLN
1400.0000 [IU]/h | INTRAMUSCULAR | Status: DC
  Administered 2016-08-04: 1400 [IU]/h via INTRAVENOUS
  Filled 2016-08-04: qty 250

## 2016-08-04 MED ORDER — SODIUM CHLORIDE 0.9 % IV SOLN
0.0000 ug/min | INTRAVENOUS | Status: DC
  Administered 2016-08-04: 40 ug/min via INTRAVENOUS
  Administered 2016-08-04: 100 ug/min via INTRAVENOUS
  Filled 2016-08-04: qty 1

## 2016-08-04 MED ORDER — FENTANYL CITRATE (PF) 100 MCG/2ML IJ SOLN
50.0000 ug | INTRAMUSCULAR | Status: DC | PRN
  Administered 2016-08-04: 50 ug via INTRAVENOUS
  Filled 2016-08-04 (×2): qty 2

## 2016-08-04 MED ORDER — FENTANYL CITRATE (PF) 250 MCG/5ML IJ SOLN
INTRAMUSCULAR | Status: DC | PRN
  Administered 2016-08-04 (×2): 50 ug via INTRAVENOUS

## 2016-08-04 MED ORDER — SODIUM CHLORIDE 0.9 % IV BOLUS (SEPSIS)
1000.0000 mL | Freq: Once | INTRAVENOUS | Status: AC
  Administered 2016-08-04: 1000 mL via INTRAVENOUS

## 2016-08-04 MED ORDER — SODIUM CHLORIDE 0.9 % IV SOLN
INTRAVENOUS | Status: DC | PRN
  Administered 2016-08-04: 19:00:00

## 2016-08-04 MED ORDER — FLUCONAZOLE IN SODIUM CHLORIDE 400-0.9 MG/200ML-% IV SOLN
400.0000 mg | INTRAVENOUS | Status: DC
  Filled 2016-08-04: qty 200

## 2016-08-04 MED ORDER — HYDROMORPHONE HCL 1 MG/ML IJ SOLN
1.0000 mg | Freq: Once | INTRAMUSCULAR | Status: AC
  Administered 2016-08-04: 1 mg via INTRAVENOUS
  Filled 2016-08-04: qty 1

## 2016-08-04 MED ORDER — CHLORHEXIDINE GLUCONATE 0.12% ORAL RINSE (MEDLINE KIT): 15.0000 mL | Freq: Two times a day (BID) | OROMUCOSAL | Status: DC

## 2016-08-04 MED ORDER — SODIUM CHLORIDE 0.9 % IV SOLN
INTRAVENOUS | Status: DC | PRN
  Administered 2016-08-04 (×2): via INTRAVENOUS

## 2016-08-04 MED ORDER — MORPHINE BOLUS VIA INFUSION
5.0000 mg | INTRAVENOUS | Status: DC | PRN
  Filled 2016-08-04: qty 20

## 2016-08-04 MED ORDER — SODIUM CHLORIDE 0.9 % IV SOLN
1.0000 mg/h | INTRAVENOUS | Status: DC
  Filled 2016-08-04: qty 10

## 2016-08-04 MED ORDER — FOLIC ACID 5 MG/ML IJ SOLN
1.0000 mg | Freq: Every day | INTRAMUSCULAR | Status: DC
  Filled 2016-08-04: qty 0.2

## 2016-08-04 MED ORDER — FENTANYL CITRATE (PF) 250 MCG/5ML IJ SOLN
INTRAMUSCULAR | Status: AC
  Filled 2016-08-04: qty 5

## 2016-08-04 MED ORDER — LACTATED RINGERS IV SOLN
INTRAVENOUS | Status: DC | PRN
  Administered 2016-08-04 (×2): via INTRAVENOUS

## 2016-08-04 MED ORDER — PROPOFOL 1000 MG/100ML IV EMUL
INTRAVENOUS | Status: AC
  Filled 2016-08-04: qty 100

## 2016-08-04 MED ORDER — ORAL CARE MOUTH RINSE: 15.0000 mL | Freq: Four times a day (QID) | OROMUCOSAL | Status: DC

## 2016-08-04 MED ORDER — PANTOPRAZOLE SODIUM 40 MG IV SOLR: 40.0000 mg | Freq: Every day | INTRAVENOUS | Status: DC

## 2016-08-04 MED ORDER — PIPERACILLIN-TAZOBACTAM 3.375 G IVPB
3.3750 g | Freq: Three times a day (TID) | INTRAVENOUS | Status: DC
  Administered 2016-08-04 (×2): 3.375 g via INTRAVENOUS
  Filled 2016-08-04 (×3): qty 50

## 2016-08-04 MED ORDER — PIPERACILLIN-TAZOBACTAM 3.375 G IVPB 30 MIN
3.3750 g | Freq: Once | INTRAVENOUS | Status: AC
  Administered 2016-08-04: 3.375 g via INTRAVENOUS
  Filled 2016-08-04: qty 50

## 2016-08-04 MED ORDER — HEPARIN SODIUM (PORCINE) 1000 UNIT/ML IJ SOLN
INTRAMUSCULAR | Status: DC | PRN
  Administered 2016-08-04: 8000 [IU] via INTRAVENOUS

## 2016-08-04 MED ORDER — FENTANYL CITRATE (PF) 100 MCG/2ML IJ SOLN: 50.0000 ug | Freq: Once | INTRAMUSCULAR | Status: DC

## 2016-08-04 MED ORDER — EPHEDRINE SULFATE 50 MG/ML IJ SOLN
INTRAMUSCULAR | Status: DC | PRN
  Administered 2016-08-04: 15 mg via INTRAVENOUS

## 2016-08-04 MED ORDER — MIDAZOLAM BOLUS VIA INFUSION
1.0000 mg | INTRAVENOUS | Status: DC | PRN
  Filled 2016-08-04: qty 2

## 2016-08-04 MED ORDER — SODIUM CHLORIDE 0.9 % IV SOLN
0.0000 mg/h | INTRAVENOUS | Status: DC
  Filled 2016-08-04: qty 10

## 2016-08-04 MED ORDER — ROCURONIUM BROMIDE 10 MG/ML (PF) SYRINGE
PREFILLED_SYRINGE | INTRAVENOUS | Status: DC | PRN
  Administered 2016-08-04: 100 mg via INTRAVENOUS
  Administered 2016-08-04: 50 mg via INTRAVENOUS

## 2016-08-04 MED ORDER — FENTANYL BOLUS VIA INFUSION
50.0000 ug | INTRAVENOUS | Status: DC | PRN
  Filled 2016-08-04: qty 50

## 2016-08-04 MED ORDER — HEPARIN BOLUS VIA INFUSION
4000.0000 [IU] | Freq: Once | INTRAVENOUS | Status: AC
  Administered 2016-08-04: 4000 [IU] via INTRAVENOUS

## 2016-08-04 MED ORDER — SODIUM CHLORIDE 0.9 % IV SOLN
INTRAVENOUS | Status: DC
  Administered 2016-08-04: 17:00:00 via INTRAVENOUS

## 2016-08-04 MED ORDER — IOPAMIDOL (ISOVUE-370) INJECTION 76%
100.0000 mL | Freq: Once | INTRAVENOUS | Status: AC | PRN
  Administered 2016-08-04: 80 mL via INTRAVENOUS

## 2016-08-04 MED ORDER — IOPAMIDOL (ISOVUE-300) INJECTION 61%: 75.0000 mL | Freq: Once | INTRAVENOUS | Status: DC | PRN

## 2016-08-04 MED ORDER — ALBUMIN HUMAN 5 % IV SOLN
INTRAVENOUS | Status: DC | PRN
  Administered 2016-08-04 (×2): via INTRAVENOUS

## 2016-08-04 MED ORDER — LACTATED RINGERS IV SOLN
INTRAVENOUS | Status: AC
  Administered 2016-08-04: 15:00:00 via INTRAVENOUS

## 2016-08-04 MED ORDER — SODIUM CHLORIDE 0.9 % IV SOLN: 250.0000 mL | INTRAVENOUS | Status: DC | PRN

## 2016-08-04 MED ORDER — SODIUM CHLORIDE 0.9 % IR SOLN
Status: DC | PRN
  Administered 2016-08-04: 3000 mL

## 2016-08-04 MED ORDER — MIDAZOLAM HCL 5 MG/5ML IJ SOLN
INTRAMUSCULAR | Status: DC | PRN
  Administered 2016-08-04: 2 mg via INTRAVENOUS

## 2016-08-04 MED ORDER — SODIUM CHLORIDE 0.9 % IV BOLUS (SEPSIS)
500.0000 mL | Freq: Once | INTRAVENOUS | Status: AC
  Administered 2016-08-04: 500 mL via INTRAVENOUS

## 2016-08-04 MED ORDER — FENTANYL 2500MCG IN NS 250ML (10MCG/ML) PREMIX INFUSION
25.0000 ug/h | INTRAVENOUS | Status: DC
  Filled 2016-08-04: qty 250

## 2016-08-04 MED ORDER — MIDAZOLAM HCL 2 MG/2ML IJ SOLN
INTRAMUSCULAR | Status: AC
  Filled 2016-08-04: qty 2

## 2016-08-04 MED ORDER — HYDROMORPHONE HCL 1 MG/ML IJ SOLN: 1.0000 mg | Freq: Once | INTRAMUSCULAR | Status: DC

## 2016-08-04 MED ORDER — CALCIUM CHLORIDE 10 % IV SOLN
INTRAVENOUS | Status: DC | PRN
  Administered 2016-08-04: 200 mg via INTRAVENOUS
  Administered 2016-08-04 (×2): 100 mg via INTRAVENOUS
  Administered 2016-08-04: 200 mg via INTRAVENOUS

## 2016-08-04 MED ORDER — MIDAZOLAM HCL 2 MG/2ML IJ SOLN
INTRAMUSCULAR | Status: AC
  Administered 2016-08-04: 4 mg
  Filled 2016-08-04: qty 4

## 2016-08-04 MED ORDER — NOREPINEPHRINE BITARTRATE 1 MG/ML IV SOLN
0.0000 ug/min | INTRAVENOUS | Status: DC
  Administered 2016-08-04: 30 ug/min via INTRAVENOUS
  Administered 2016-08-04: 15 ug/min via INTRAVENOUS
  Administered 2016-08-04: 6 ug/min via INTRAVENOUS
  Filled 2016-08-04 (×7): qty 4

## 2016-08-04 MED ORDER — ONDANSETRON HCL 4 MG/2ML IJ SOLN
4.0000 mg | Freq: Once | INTRAMUSCULAR | Status: AC
  Administered 2016-08-04: 4 mg via INTRAVENOUS
  Filled 2016-08-04: qty 2

## 2016-08-04 MED ORDER — SODIUM BICARBONATE 8.4 % IV SOLN
INTRAVENOUS | Status: DC | PRN
  Administered 2016-08-04: 25 meq via INTRAVENOUS

## 2016-08-04 SURGICAL SUPPLY — 63 items
ADH SKN CLS APL DERMABOND .7 (GAUZE/BANDAGES/DRESSINGS) ×4
CANISTER SUCT 3000ML PPV (MISCELLANEOUS) ×4 IMPLANT
CANISTER WOUND CARE 500ML ATS (WOUND CARE) ×2 IMPLANT
CATH EMB 3FR 80CM (CATHETERS) ×2 IMPLANT
CATH EMB 4FR 40CM (CATHETERS) ×2 IMPLANT
CLIP TI MEDIUM 24 (CLIP) ×4 IMPLANT
CLIP TI WIDE RED SMALL 24 (CLIP) ×4 IMPLANT
COVER MAYO STAND STRL (DRAPES) ×4 IMPLANT
DERMABOND ADVANCED (GAUZE/BANDAGES/DRESSINGS) ×4
DERMABOND ADVANCED .7 DNX12 (GAUZE/BANDAGES/DRESSINGS) ×4 IMPLANT
ELECT BLADE 4.0 EZ CLEAN MEGAD (MISCELLANEOUS) ×8
ELECT BLADE 6.5 EXT (BLADE) IMPLANT
ELECT REM PT RETURN 9FT ADLT (ELECTROSURGICAL) ×4
ELECTRODE BLDE 4.0 EZ CLN MEGD (MISCELLANEOUS) ×2 IMPLANT
ELECTRODE REM PT RTRN 9FT ADLT (ELECTROSURGICAL) ×2 IMPLANT
FELT TEFLON 4 X1 (MISCELLANEOUS) IMPLANT
GLOVE BIO SURGEON STRL SZ 6.5 (GLOVE) ×2 IMPLANT
GLOVE BIO SURGEON STRL SZ7.5 (GLOVE) ×2 IMPLANT
GLOVE BIO SURGEONS STRL SZ 6.5 (GLOVE) ×2
GLOVE BIOGEL PI IND STRL 6.5 (GLOVE) IMPLANT
GLOVE BIOGEL PI IND STRL 7.0 (GLOVE) IMPLANT
GLOVE BIOGEL PI IND STRL 7.5 (GLOVE) ×2 IMPLANT
GLOVE BIOGEL PI IND STRL 8 (GLOVE) IMPLANT
GLOVE BIOGEL PI INDICATOR 6.5 (GLOVE) ×4
GLOVE BIOGEL PI INDICATOR 7.0 (GLOVE) ×2
GLOVE BIOGEL PI INDICATOR 7.5 (GLOVE) ×4
GLOVE BIOGEL PI INDICATOR 8 (GLOVE) ×2
GLOVE SURG SS PI 7.5 STRL IVOR (GLOVE) ×6 IMPLANT
GOWN STRL REUS W/ TWL LRG LVL3 (GOWN DISPOSABLE) ×4 IMPLANT
GOWN STRL REUS W/ TWL XL LVL3 (GOWN DISPOSABLE) ×2 IMPLANT
GOWN STRL REUS W/TWL LRG LVL3 (GOWN DISPOSABLE) ×12
GOWN STRL REUS W/TWL XL LVL3 (GOWN DISPOSABLE) ×4
HEMOSTAT SNOW SURGICEL 2X4 (HEMOSTASIS) IMPLANT
INSERT FOGARTY 61MM (MISCELLANEOUS) ×4 IMPLANT
INSERT FOGARTY SM (MISCELLANEOUS) ×10 IMPLANT
KIT BASIN OR (CUSTOM PROCEDURE TRAY) ×4 IMPLANT
KIT ROOM TURNOVER OR (KITS) ×4 IMPLANT
NS IRRIG 1000ML POUR BTL (IV SOLUTION) ×8 IMPLANT
PACK AORTA (CUSTOM PROCEDURE TRAY) ×4 IMPLANT
PAD ARMBOARD 7.5X6 YLW CONV (MISCELLANEOUS) ×8 IMPLANT
SPONGE ABDOMINAL VAC ABTHERA (MISCELLANEOUS) ×2 IMPLANT
SUT ETHIBOND 5 LR DA (SUTURE) IMPLANT
SUT PDS AB 1 TP1 54 (SUTURE) ×10 IMPLANT
SUT PROLENE 3 0 SH 48 (SUTURE) ×12 IMPLANT
SUT PROLENE 5 0 C 1 24 (SUTURE) IMPLANT
SUT PROLENE 5 0 C 1 36 (SUTURE) ×4 IMPLANT
SUT PROLENE 6 0 BV (SUTURE) ×6 IMPLANT
SUT PROLENE 6 0 CC (SUTURE) ×2 IMPLANT
SUT SILK 2 0 (SUTURE) ×4
SUT SILK 2 0SH CR/8 30 (SUTURE) ×8 IMPLANT
SUT SILK 2-0 18XBRD TIE 12 (SUTURE) IMPLANT
SUT SILK 3 0 (SUTURE) ×4
SUT SILK 3 0 SH CR/8 (SUTURE) ×2 IMPLANT
SUT SILK 3-0 18XBRD TIE 12 (SUTURE) IMPLANT
SUT VIC AB 2-0 CT1 27 (SUTURE) ×8
SUT VIC AB 2-0 CT1 TAPERPNT 27 (SUTURE) ×4 IMPLANT
SUT VIC AB 3-0 SH 27 (SUTURE) ×16
SUT VIC AB 3-0 SH 27X BRD (SUTURE) ×8 IMPLANT
SUT VICRYL 4-0 PS2 18IN ABS (SUTURE) ×8 IMPLANT
SYRINGE 3CC LL L/F (MISCELLANEOUS) ×2 IMPLANT
TOWEL BLUE STERILE X RAY DET (MISCELLANEOUS) ×8 IMPLANT
TRAY FOLEY W/METER SILVER 16FR (SET/KITS/TRAYS/PACK) ×4 IMPLANT
WATER STERILE IRR 1000ML POUR (IV SOLUTION) ×8 IMPLANT

## 2016-08-04 NOTE — Transfer of Care (Signed)
Immediate Anesthesia Transfer of Care Note  Patient: Cody [name]  Procedure(s) Performed: Procedure(s): MESENTERIC ARTERY BYPASS (N/A)  Patient Location: ICU  Anesthesia Type:General  Level of Consciousness: Patient remains intubated per anesthesia plan  Airway & Oxygen Therapy: Patient remains intubated per anesthesia plan and Patient placed on Ventilator (see vital sign flow sheet for setting)  Post-op Assessment: Report given to RN and Post -op Vital signs reviewed and stable  Post vital signs: Reviewed and stable  Last Vitals:  Vitals:   07/10/2016 1715 07/31/2016 1730  BP: (!) 62/46 (!) 81/59  Pulse: 98 (!) 103  Resp: (!) 23 (!) 26  Temp:      Last Pain:  Vitals:   07/13/2016 1633  TempSrc: Oral  PainSc:       Patients Stated Pain Goal: 0 (03/47/42 5956)  Complications: No apparent anesthesia complications

## 2016-08-04 NOTE — Op Note (Signed)
Patient name: Cody [name] MRN: [id number] DOB: 21-Sep-1965 Sex: male  07/09/2016 Pre-operative Diagnosis: Ischemic bowel, mesenteric occlusion Post-operative diagnosis:  Same Surgeon:  Annamarie Major Co-Surgeon:  Ralene Ok Procedure:   #1: Exploratory laparotomy   #2: Superior mesenteric artery embolectomy   #3:  Placement of abdominal wound VAC Anesthesia:  Gen. Blood Loss:  See anesthesia record Specimens:  Mesenteric thrombus  Findings:  Nonviable small bowel and the colon.  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  His lactate was found to be severely elevated.  CT scan showed a superior mesenteric artery occlusion, age indeterminate, along with pneumatosis.  I discussed with the patient and wife that this is a life-threatening situation, and drainage to go the operating room for evaluation  Procedure:  The patient was identified in the holding area and taken to [facility]  The patient was then placed supine on the table. general anesthesia was administered.  The patient was prepped and draped in the usual sterile fashion.  A time out was called and antibiotics were administered.  A midline incision was made from xiphoid to below the umbilicus.  Cautery was used to divide the subcutaneous tissue down to the fascia was opened with cautery.  The peritoneum was entered sharply and the opening was extended throughout the length of the incision.  The bowel was then eviscerated.  The colon was frankly ischemic throughout it's entire course.  The small bowel was run from the ligament of Treitz to the terminal ileum.  Initially, the proximal 10-15 centimeters of jejunum appeared viable, however the rest of the intestine did not.  I then dissected down to the middle colic artery and traced this back to the superior mesenteric artery which appeared to have  chronic disease within this.  Once I had adequate exposure of the superior mesenteric artery, systemic heparinization was performed.  After the heparin circulated, the artery was occluded with Silastic loops.  A left blade was used to make an arteriotomy which was extended with Potts scissors in a transverse fashion.  It was clear that there was chronic disease throughout the superior mesenteric artery.  I tried to pass catheters back into the aorta and had difficulty.  I was finally able to get a #3 catheter to go into the aorta.  I pulled out some acute thrombus however most of this appeared to be chronic disease.  I also passed a #3 Fogarty distally.  There was okay backbleeding.  I then elected to close the arteriotomy transversely.  In performing the thromboembolectomy, the plaque had severely disrupted the artery.  I tried to close this transversely by the integrity of the artery had been severely compromised.  I temporarily placed metal clips on the artery and then we reexamined the bowel.  At this point, the entire bowel appeared to be unsalvageable.  There was no evidence of peristalsis.  We could not get a Doppler signal in the bowel.  I did not think that revascularization at this point had any hope of salvaging any of his bowel.  We both agreed that this was a nonsalvageable situation.  A abdominal wound VAC was then placed.  The patient was taken back to the intensive care unit.   Disposition:  To ICU in guarded condition, intubated   V. Annamarie Major, M.D. [facility] Office: [phone] Pager:  [phone] 

## 2016-08-04 NOTE — Op Note (Addendum)
07/27/2016  7:49 PM  PATIENT:  Cody [name]  51 y.o. male  PRE-OPERATIVE DIAGNOSIS:  Embolous  POST-OPERATIVE DIAGNOSIS:  Ischemic bowel  PROCEDURE:  Procedure(s): Exploratory Laparotomy Evaluation for Ischemic Bowel(N/A)  SURGEON:  Surgeon(s) and Role:    * Serafina Mitchell, MD - Primary    * Ralene Ok, MD - Co-Surgeon  ANESTHESIA:   general  EBL:  Total I/O In: 1000 [I.V.:1000] Out: 200 [Blood:200]  BLOOD ADMINISTERED:none  DRAINS: none   LOCAL MEDICATIONS USED:  NONE  SPECIMEN:  No Specimen  DISPOSITION OF SPECIMEN:  N/A  COUNTS:  YES  TOURNIQUET:  * No tourniquets in log *  DICTATION: .Dragon Dictation Details of the procedure:  After the patient was consented he was taken back to the OR and placed in the supine position and was already intubated. GETA was started.  He was prepped and draped in the standard fashion.  A time out was called and all facts were verified.  A midline incision was made with a #10 blade.  Cautery was used to maintain hemostasis and the midline fascia was found.  The peritoneum was incised sharply.  The abdomen was entered and the fascia was extended to the length of the skin incision.   The bowel was eviscerated.  The colon was very distended and the entire colon appeared ischemic looking.  The small bowel was run from the ligament of Treitz to the terminal ileum.  The  Proximal 10-15cm of jejunun appeared viable initially.  The rest of the small bowel appeared to have patchy necrosis and was aperastaltic.   Dr. Trula Slade dissected and interogated the SMA.  See his op note.  Once the bowel appeared unsaveable, we decided to terminate the procedure as the bowel was growingly ischemic.  A blue vac pack was placed and the patient was taken to the ICU intubated and in stable condition.     PLAN OF CARE: Admit to inpatient   PATIENT DISPOSITION:  ICU - intubated and critically ill.   Delay start of Pharmacological VTE agent  (>24hrs) due to surgical blood loss or risk of bleeding: not applicable

## 2016-08-04 NOTE — ED Notes (Signed)
1st blood culture already obtained. EDP gave verbal order to start antibiotics now. Phlebotomist having difficulty obtaining 2nd blood culture at this time.

## 2016-08-04 NOTE — Progress Notes (Signed)
Pt received back from the OR and placed on previous vent settings.

## 2016-08-04 NOTE — ED Notes (Signed)
Phlebotomy unable to obtain 2nd blood culture. EDP notified.
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
## 2016-08-04 NOTE — Progress Notes (Addendum)
Pharmacy Antibiotic Note  Cody [name] is a 51 y.o. male admitted on 07/28/2016 with abdominal pain. Found to have possible mesenteric ischemia and sepsis.  Pharmacy has been consulted for Zosyn and fluconazole dosing.  Afebrile, WBC 26.9. LA elevated. SCr elevated at 1.83, CrCl ~20ml/min.  Plan: Start Zosyn 3.375 gm IV q8h (4 hour infusion) Start fluconazole 400mg  IV Q24h Monitor clinical picture, renal function F/U C&S, abx deescalation / LOT   Height: 5\' 10"  (177.8 cm) Weight: 218 lb (98.9 kg) IBW/kg (Calculated) : 73  Temp (24hrs), Avg:97.7 F (36.5 C), Min:97.6 F (36.4 C), Max:97.7 F (36.5 C)   Recent Labs Lab 07/16/2016 1202 07/08/2016 1204 07/21/2016 1247  WBC  --  26.9*  --   CREATININE  --  1.83*  --   LATICACIDVEN 14.7*  --  16.37*    Estimated Creatinine Clearance: 55.1 mL/min (A) (by C-G formula based on SCr of 1.83 mg/dL (H)).    No Known Allergies  Thank you for allowing pharmacy to be a part of this patient's care.  Reginia Naas 07/24/2016 4:43 PM

## 2016-08-04 NOTE — Anesthesia Procedure Notes (Signed)
Central Venous Catheter Insertion Performed by: Effie Berkshire, anesthesiologist Start/End05/08/2016 6:25 PM, 08/04/2016 6:35 PM Patient location: Pre-op. Preanesthetic checklist: patient identified, IV checked, site marked, risks and benefits discussed, surgical consent, monitors and equipment checked, pre-op evaluation, timeout performed and anesthesia consent Position: Trendelenburg Lidocaine 1% used for infiltration and patient sedated Hand hygiene performed , maximum sterile barriers used  and Seldinger technique used Catheter size: 8 Fr Total catheter length 16. Central line was placed.Double lumen Procedure performed using ultrasound guided technique. Ultrasound Notes:anatomy identified, needle tip was noted to be adjacent to the nerve/plexus identified, no ultrasound evidence of intravascular and/or intraneural injection and image(s) printed for medical record Attempts: 1 Following insertion, dressing applied, line sutured and Biopatch. Post procedure assessment: blood return through all ports  Patient tolerated the procedure well with no immediate complications.

## 2016-08-04 NOTE — Significant Event (Signed)
Patient taken to OR by CRNA Almyra Free. Patient's spouse at bedside, is updated by staff.     Cody [name]

## 2016-08-04 NOTE — Progress Notes (Signed)
[place] Progress Note Patient Name: Cody [name] DOB: [birth year] MRN: [id number]   Date of Service  07/08/2016  HPI/Events of Note  Transfer from outside hospital with mesenteric ischemia and acute renal failure. Also notably has lactic acidosis. [place] check shows patient resting with nurse at bedside. Currently on heparin drip. Reports he has not urinated for days. Denies any prostate or urination problems.   eICU Interventions  1. Intensivist notified of patient's arrival for bedside assessment 2. Repeat lactic acid ordered 3. Morning labs ordered for tomorrow 4. Requested nurse obtain bladder scan and if urine present within bladder place Foley catheter      Intervention Category Evaluation Type: New Patient Evaluation  Tera Partridge 07/12/2016, 4:19 PM
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
## 2016-08-04 NOTE — Progress Notes (Signed)
Checked back w/ wife after several more family members arrived. Provided emotional/spiritual support and prayer, which wife appreciated (including that PA and nurse joined). Chaplain available for f/u.   07/19/2016 2200  Clinical Encounter Type  Visited With Patient and family together;Health care provider  Visit Type Follow-up;Psychological support;Spiritual support;Social support;Critical Care;Other (Comment) (transitioned to comfort care)  Referral From Nurse  Spiritual Encounters  Spiritual Needs Prayer;Emotional;Grief support  Stress Factors  Patient Stress Factors Other (Comment) (expected to pass soon)  Family Stress Factors Family relationships;Health changes;Loss;Loss of control   Gerrit Heck, Chaplain
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
## 2016-08-04 NOTE — ED Notes (Addendum)
CRITICAL VALUE ALERT  Critical Value:  Lactic Acid 16.37  Date & Time Notied:  07/26/2016  Provider Notified: Dr. Reather Converse  Orders Received/Actions taken: EDP will order IV antibiotics, RN to obtain 2nd PIV

## 2016-08-04 NOTE — Procedures (Signed)
Intubation Procedure Note MARSDEN ZAINO [id number] 11/23/1965  Procedure: Intubation Indications: Airway protection and maintenance  Procedure Details Consent: Risks of procedure as well as the alternatives and risks of each were explained to the (patient/caregiver).  Consent for procedure obtained. Time Out: Verified patient identification, verified procedure, site/side was marked, verified correct patient position, special equipment/implants available, medications/allergies/relevent history reviewed, required imaging and test results available.  Performed  Maximum sterile technique was used including antiseptics, cap, gloves, gown, hand hygiene, mask and sheet.  MAC and 4 Glidescope  Medications Fentanyl 232mg Propofol 763mVersed 2 mm  Grade 1 airway  Evaluation Hemodynamic Status: BP stable throughout; O2 sats: stable throughout Patient's Current Condition: stable Complications: No apparent complications Patient did tolerate procedure well. Chest X-ray ordered to verify placement.  CXR: pending.  PaGeorgann HousekeeperAGACNP-BC [facility] Pager [phone] ([phone]/29/2018 5:13 PM    Uncompensated met acidosis Refractory pain No asp during  DaLavon PaganiniFeTitus MouldMD, FAPueblitosgr: 37[facility]

## 2016-08-04 NOTE — Progress Notes (Signed)
Responded to page for pt who'd arrived from [facility], was in [place] when doctors found his condition non-survivable and so informed family.. [facility] staff called for chaplain to come as pt transitioned to comfort care. Sought wife who was reportedly still calling other family members, eventually met up w/ her and 3 other family members in pt's rm. Provided emotional/spiritual support, gave wife prayer shawl, which she appreciated.   Wife requested prayer when remaining family members arrive. Chaplain available for f/u.   07/18/2016 2000  Clinical Encounter Type  Visited With Patient and family together;Health care provider  Visit Type Initial;Psychological support;Spiritual support;Social support;Critical Care  Referral From Nurse  Spiritual Encounters  Spiritual Needs Emotional;Grief support  Stress Factors  Patient Stress Factors Health changes;Loss of control;Other (Comment) (transition to comfort care)  Family Stress Factors Family relationships;Health changes;Loss;Loss of control   Gerrit Heck, Chaplain
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
## 2016-08-04 NOTE — Progress Notes (Signed)
Family at bedside, awaiting a couple of family members prior to terminal extubation. Spoke to Dr. Trula Slade and Dr. Ashok Cordia Fuller Song, PA will be sent to the room to discuss goals of care and code status with the family.

## 2016-08-04 NOTE — ED Notes (Signed)
Pt vomited approx 30cc dark colored emesis.  MD aware
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
## 2016-08-04 NOTE — Progress Notes (Signed)
[facility] Progress Note Patient Name: Cody [name] DOB: May 25, 1965 MRN: [id number]   Date of Service  07/19/2016  HPI/Events of Note  Awaiting arrival of remainder of family members before transitioning to full comfort care. Nurse reports she believes most of the family is at bedside now. Placing withdrawal life-sustaining treatment order set. This will allow morphine infusion to be obtained and started before terminal ventilator wean and one way extubation. Holding on ventilator wean until family and nursing staff are ready. Utilizing morphine for pain control given patient's open abdomen.   eICU Interventions  Withdrawal life-sustaining order set placed & plan discussed with nurse at bedside.      Intervention Category Major Interventions: End of life / care limitation discussion  Tera Partridge 07/08/2016, 10:51 PM

## 2016-08-04 NOTE — Significant Event (Signed)
Received patient into 2H02 from [facility], arrived via [place] on stretcher. Patient is awake, is hurting, per patient pain level is 20 on a 1-10 pain scale. Abdomen is distended and tender. No bowels sounded asculated; no doppler on left foot, positive doppler on right foot-communicated this to providers (CCM and vascular). Family (spouse) at bedside and is updated. Belongings given to his spouse (pants, undergarment, dentures, a ring).     Cody [name]

## 2016-08-04 NOTE — ED Triage Notes (Signed)
Pt comes in by EMS from home for abdominal pain starting 3 weeks ago. He was seen here last week. On EMS arrival, pt was grey in color. Pt admits to drinking alcohol over the weekend. He has had decreased urine output in the last 4 days.

## 2016-08-04 NOTE — Procedures (Signed)
OGT placed  I placed small bore gastric tube placed without any resistance 1 liter dark fluid our immediate To wall suctiojn  Cody [name]. Cody Mould, MD, [place] Pgr: [facility]

## 2016-08-04 NOTE — Significant Event (Signed)
Return a full bag of versed drip (not used as patient is in [place]) to pharmacy and handed over to [facility].     Cody [name]

## 2016-08-04 NOTE — Progress Notes (Signed)
Checked back w/ pt's wife, who about 2340 was speaking to pt's 2 sisters, who are driving here and are still 45 min. away. She understands they may not make it in time. Providing continued ministry of presence to various family members as many of them visit and then w/draw for a while. Chaplain available for f/ui.  Gerrit Heck, Chaplain

## 2016-08-04 NOTE — Anesthesia Preprocedure Evaluation (Addendum)
Anesthesia Evaluation  Patient identified by MRN, date of birth, ID band Patient unresponsive  Preop documentation limited or incomplete due to emergent nature of procedure.  History of Anesthesia Complications (+) DIFFICULT IV STICK / SPECIAL LINE  Airway Mallampati: Intubated       Dental   Pulmonary shortness of breath, COPD, Current Smoker,     + decreased breath sounds      Cardiovascular hypertension,  Rhythm:Regular Rate:Normal     Neuro/Psych    GI/Hepatic PUD, GERD  ,  Endo/Other  negative endocrine ROS  Renal/GU Renal disease     Musculoskeletal   Abdominal   Peds  Hematology  (+) anemia ,   Anesthesia Other Findings   Reproductive/Obstetrics                            Anesthesia Physical Anesthesia Plan  ASA: III  Anesthesia Plan: General   Post-op Pain Management:    Induction: Intravenous  Airway Management Planned: Oral ETT  Additional Equipment: Arterial line, CVP and Ultrasound Guidance Line Placement  Intra-op Plan:   Post-operative Plan: Post-operative intubation/ventilation  Informed Consent: I have reviewed the patients History and Physical, chart, labs and discussed the procedure including the risks, benefits and alternatives for the proposed anesthesia with the patient or authorized representative who has indicated his/her understanding and acceptance.   Only emergency history available  Plan Discussed with: CRNA and Anesthesiologist  Anesthesia Plan Comments:        Anesthesia Quick Evaluation

## 2016-08-04 NOTE — Progress Notes (Signed)
PCCM Interval Progress Note  Asked to meet with family to discuss goals of care given OR findings of nonviable small bowel and colon.  Family has already discussed operative findings and prognosis with Dr. Trula Slade.  They understand that this is unfortunately not survivable.  They are in agreement to DNR status at this time while we await additional family to arrive. Once all family arrive and have a chance to visit with Cody [name], family has opted to move to terminal extubation and comfort measures.  RN updated with plan of care and she will notify our team once family is ready to move to comfort care.   Montey Hora, [facility] Pager: [phone]  or [phone] 07/28/2016, 10:26 PM

## 2016-08-05 ENCOUNTER — Encounter (HOSPITAL_COMMUNITY): Payer: Self-pay | Admitting: Surgery

## 2016-08-05 ENCOUNTER — Inpatient Hospital Stay (HOSPITAL_COMMUNITY): Payer: Medicaid Other

## 2016-08-05 ENCOUNTER — Ambulatory Visit: Payer: BLUE CROSS/BLUE SHIELD | Admitting: Gastroenterology

## 2016-08-07 NOTE — Addendum Note (Signed)
Addendum  created 08/07/16 1222 by Effie Berkshire, MD   Sign clinical note

## 2016-08-07 NOTE — Progress Notes (Signed)
Stopped by to check on family when pt had just passed. Pt's two sisters had time to arrive. Provided grief support, offered condolences to all, especially sisters and their children, who believed pt had waited for them to arrive.    08-28-16 0400  Clinical Encounter Type  Visited With Family;Health care provider  Visit Type Follow-up;Psychological support;Spiritual support;Social support;Death  Referral From [place] Needs Emotional;Grief support  Stress Factors  Family Stress Factors Loss   Gerrit Heck, Chaplain

## 2016-08-07 NOTE — Procedures (Signed)
Extubation Procedure Note  Patient Details:   Name: ZIDANE RENNER DOB: 07-30-1965 MRN: [id number]   Airway Documentation:  Airway 7.5 mm (Active)    Evaluation  O2 sats: transiently fell during during procedure Complications: Complications of terminal extubation. Patient did not tolerate procedure well. Bilateral Breath Sounds: Rhonchi, Expiratory wheezes   No  Pt terminally extubated.   Dulcy Fanny 08-24-2016, 1:48 AM

## 2016-08-07 NOTE — Progress Notes (Signed)
On their way out of 2H, expressed condolences to pt's wife, who hugged me, thanking me for beautiful prayer and prayer shawl -- also to pt's two sisters and their children, who were still thankful pt "had waited for them to come."   2016-08-12 0500  Clinical Encounter Type  Visited With Family  Visit Type Follow-up;Psychological support;Spiritual support;Social support;Death  Referral From [place] Needs Emotional;Grief support   Gerrit Heck, Chaplain

## 2016-08-07 NOTE — Progress Notes (Signed)
Pt was terminally extubated to room air at 0140 with RN and RT in the room. Family is at bedside, emotional support has been given by RN and chaplain throughout the night. CDS referral made at 0200. Will update as necessary.

## 2016-08-07 NOTE — Anesthesia Postprocedure Evaluation (Addendum)
Anesthesia Post Note  Patient: Cody [name]  Procedure(s) Performed: Procedure(s) (LRB): Embolectomy Mesenteric Artery (N/A) EXPLORATORY LAPAROTOMY (N/A)  Patient location during evaluation: SICU Anesthesia Type: General Level of consciousness: sedated Pain management: pain level controlled Vital Signs Assessment: post-procedure vital signs reviewed and stable Respiratory status: patient remains intubated per anesthesia plan Cardiovascular status: stable Anesthetic complications: no       Last Vitals:  Vitals:   07/14/2016 2345 2016-08-09 0000  BP: 99/82 (!) 95/56  Pulse: (!) 113 (!) 110  Resp: (!) 25 (!) 26  Temp:      Last Pain:  Vitals:   07/26/2016 2026  TempSrc: Oral  PainSc:                  Effie Berkshire

## 2016-08-07 NOTE — Progress Notes (Signed)
Time of death: 60 - family and RN at bedside, confirmed by two RN's.  CDS and MD made aware.

## 2016-08-07 NOTE — Progress Notes (Signed)
150 cc of morphine and 150 cc of fentanyl wasted down the sink, witnessed by Tommy Rainwater, RN.

## 2016-08-07 DEATH — deceased

## 2016-08-09 LAB — CULTURE, BLOOD (ROUTINE X 2)
Culture: NO GROWTH
Special Requests: ADEQUATE

## 2016-08-12 ENCOUNTER — Telehealth: Payer: Self-pay

## 2016-08-12 NOTE — Telephone Encounter (Signed)
On 08/12/2016 I received a death certificate from [place] (orginal). The death certificate is for cremation. The patient is a patient of Doctor Titus Mould. The death certificate will be taken to [facility] this pm for signature. On 2016-08-28 I received the death certificate back from Doctor Titus Mould. I got the death certificate ready and called the funeral home to let them know the death certificate is ready for pickup. I also faxed a copy to the funeral home per the funeral home request.

## 2016-09-06 NOTE — Discharge Summary (Addendum)
Patient with history of alcohol abuse, COPD, gastritis presents to the ED for recurrent vomiting and worsening abdominal pain for the past week. He was seen in the ED one week ago for gastroenteritis symptoms and felt improved prior to discharge. patient returned due to diffuse abdominal pain. 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  Patient was bolused with Normal saline and started on heparin drip. Vascular and general surgery was consulted and plan for OR.  PCCM was consulted for admission. Patient currently reports 10/10 abdominal pain.   ETT placed, taken to OR, found diffuse dead bowel, unable to benefit from any operation, comfort care provided  Final diagnosis upon death\ 1. Diffuse small bowel and colon ischemia, dead bowel 2. Shock, sirs, Lik[place] distrubutive shock, hypovolemic shock, shock undetermined 3. Acute resp failure 4. SMA occlusion 5. Left ischemic lower limb 6. Comfort care  Cody [name]. Titus Mould, MD, [place] Pgr: [facility]

## 2017-07-20 IMAGING — RF DG CHOLANGIOGRAM OPERATIVE
1 series · 4 of 4 positions shown · non-contrast
Comparison: None.

CLINICAL DATA: Cholelithiasis

EXAM:
INTRAOPERATIVE CHOLANGIOGRAM
TECHNIQUE: Cholangiographic images from the C-arm fluoroscopic device were
submitted for interpretation post-operatively. Please see the
procedural report for the amount of contrast and the fluoroscopy
time utilized.

[Series 1: run · 4 of 77 frames shown]
[frame 1/77]
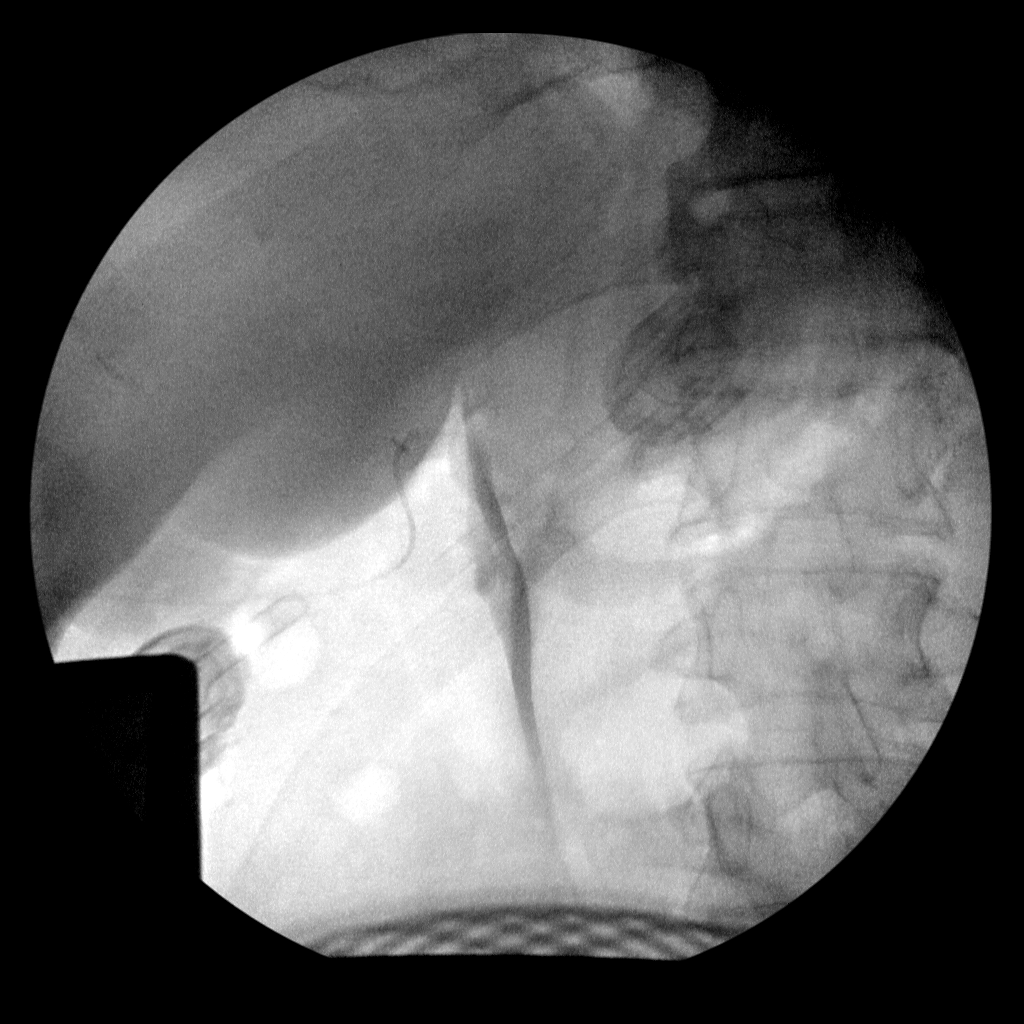
[frame 12/77]
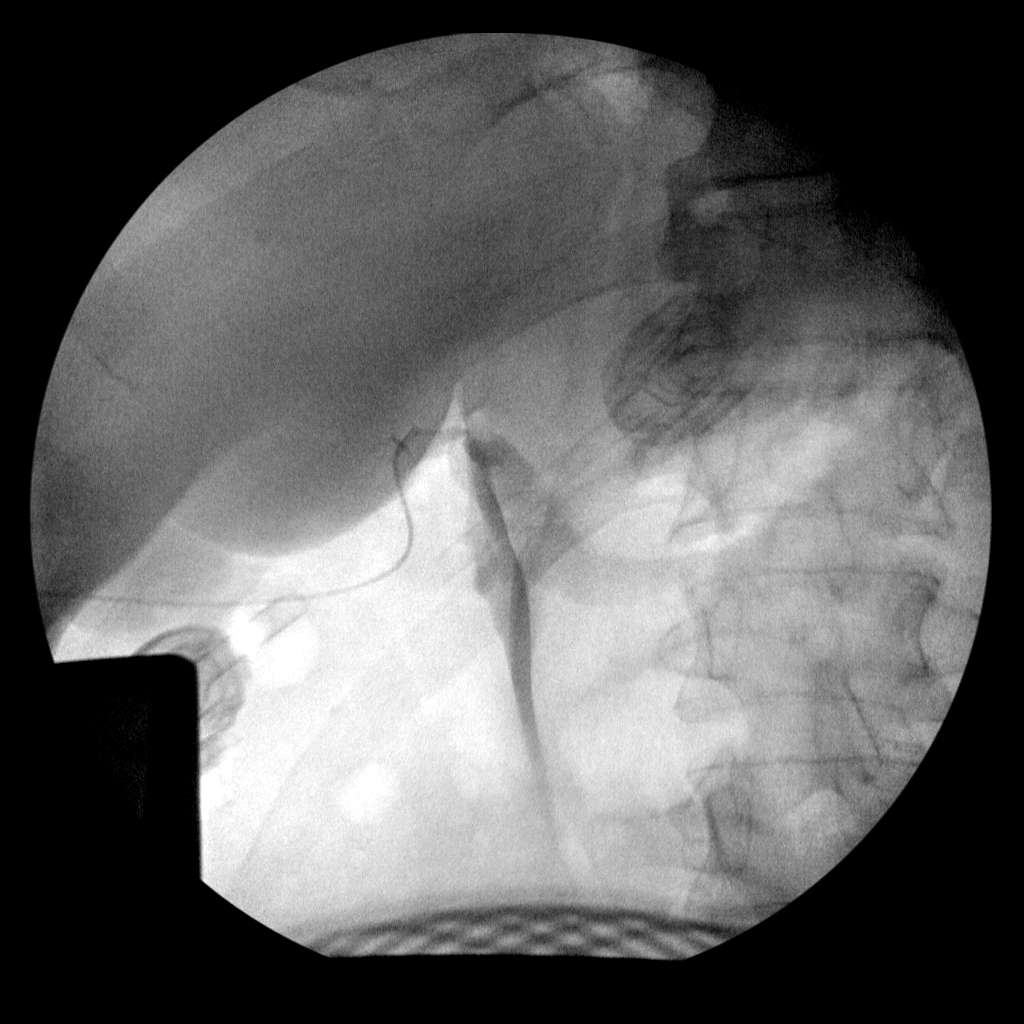
[frame 39/77]
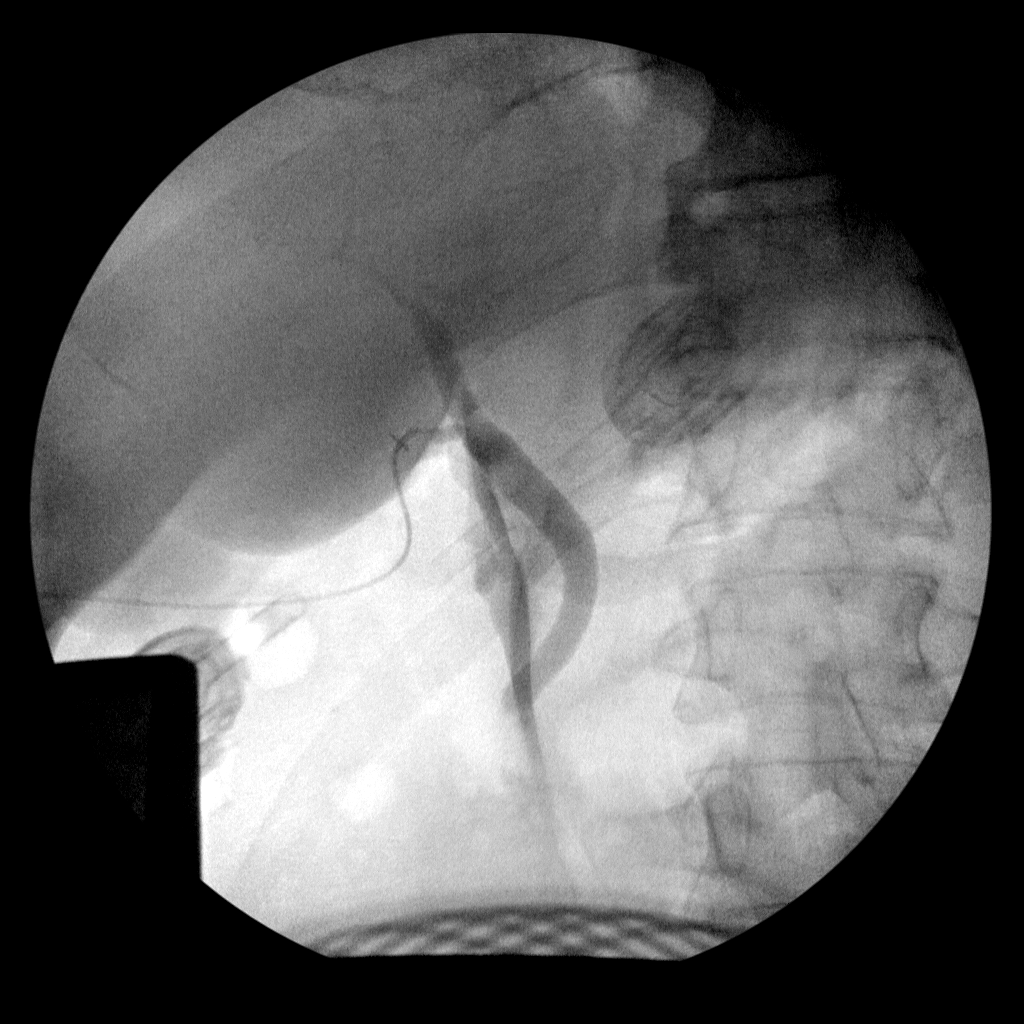
[frame 66/77]
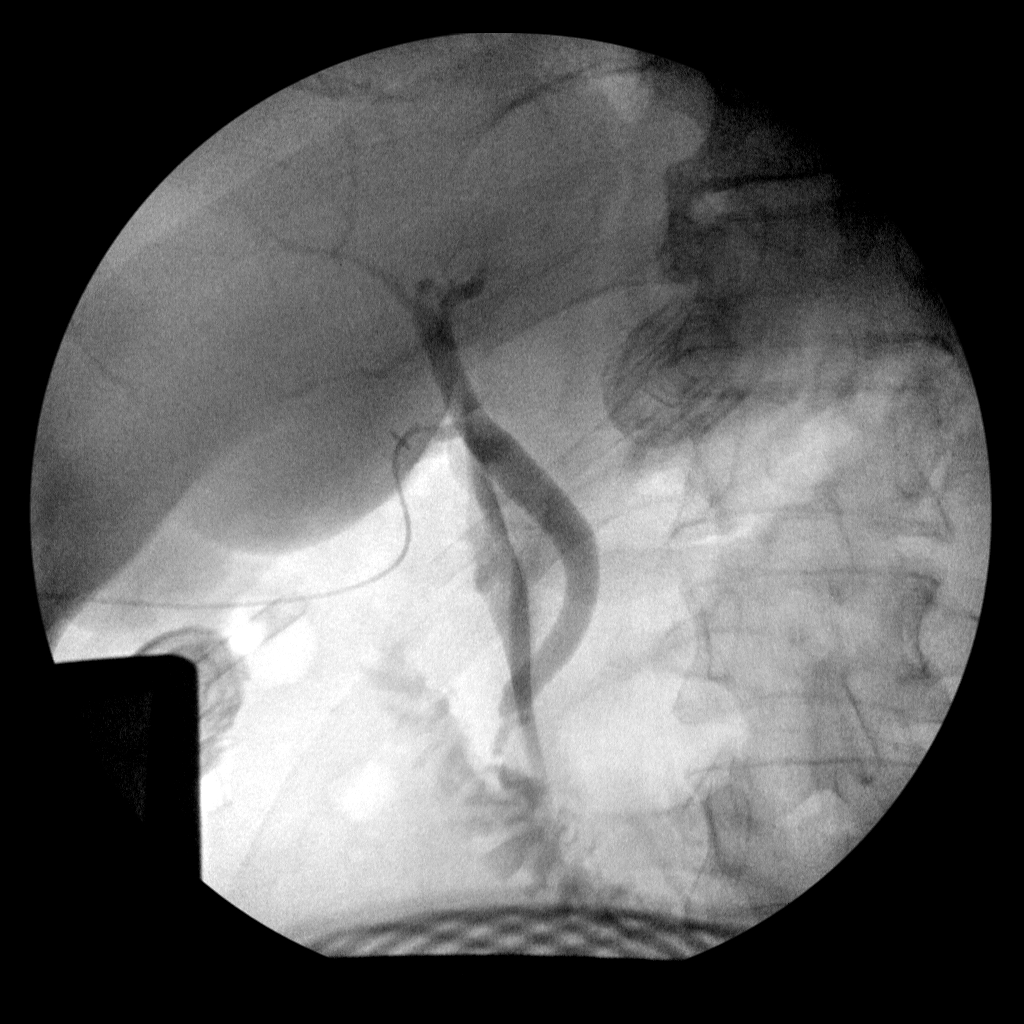

[4 of 4 positions shown; findings below may reference images not displayed]

FINDINGS: Contrast fills the biliary tree and duodenum without filling defects
in the common bile duct to suggest common duct stones.
IMPRESSION: Patent biliary tree.

## 2018-05-25 IMAGING — CT CT ABD-PELV W/ CM
2 of 5 series · 16 of 46 positions shown, 18 images · IV contrast (Isovue)
Comparison: CT abdomen pelvis 02/20/2004

CLINICAL DATA: Atherosclerotic disease

EXAM:
CT ABDOMEN AND PELVIS WITH CONTRAST
TECHNIQUE: Multidetector CT imaging of the abdomen and pelvis was performed
using the standard protocol following bolus administration of
intravenous contrast.
CONTRAST:  100mL 3TPJQI-0WW IOPAMIDOL (3TPJQI-0WW) INJECTION 61%

[Series 2: axial st · axial · 0.85mm/px · z∈[+777,+1192]mm · 13 of 95 slices shown, 15 images]
[im 6/95  soft-tissue]
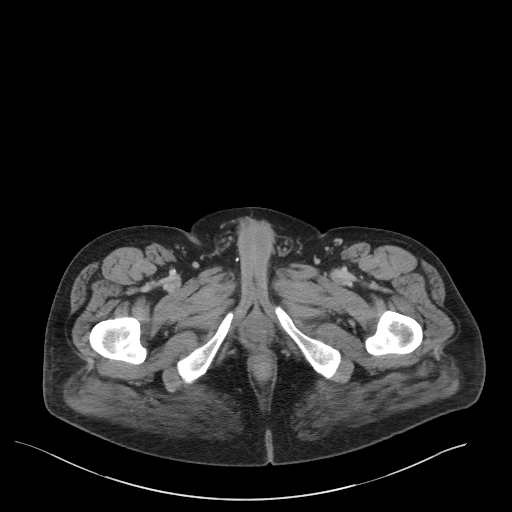
[im 6/95  bone]
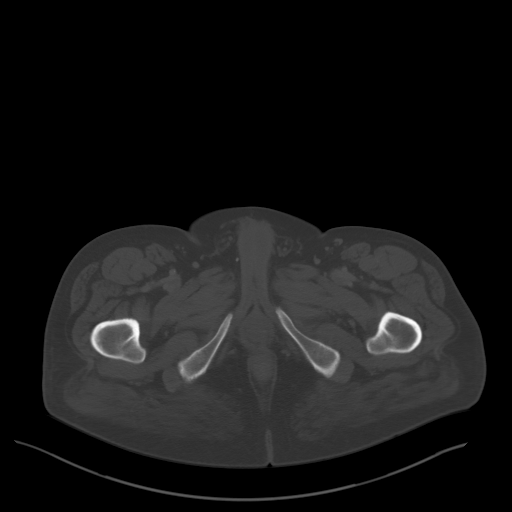
[im 12/95  soft-tissue]
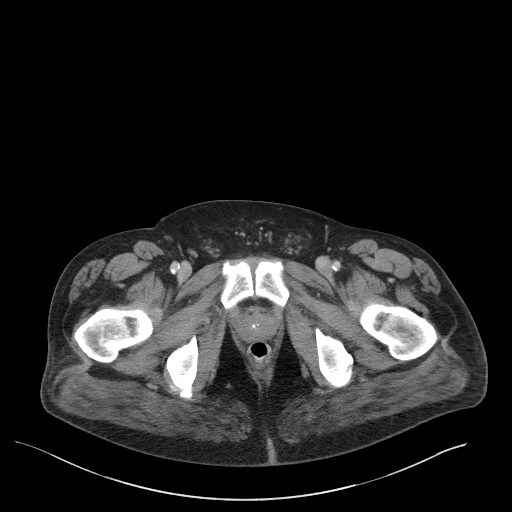
[im 23/95  soft-tissue]
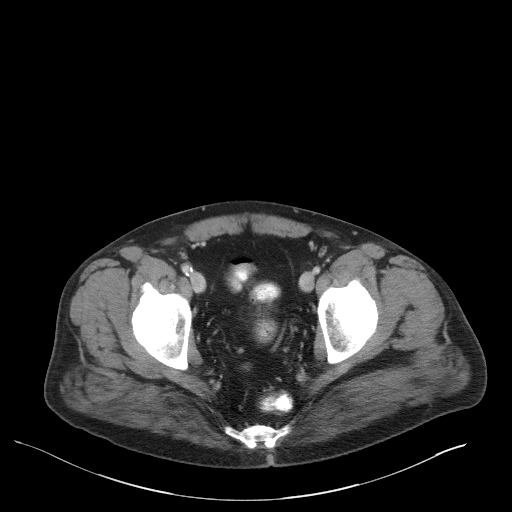
[im 28/95  soft-tissue]
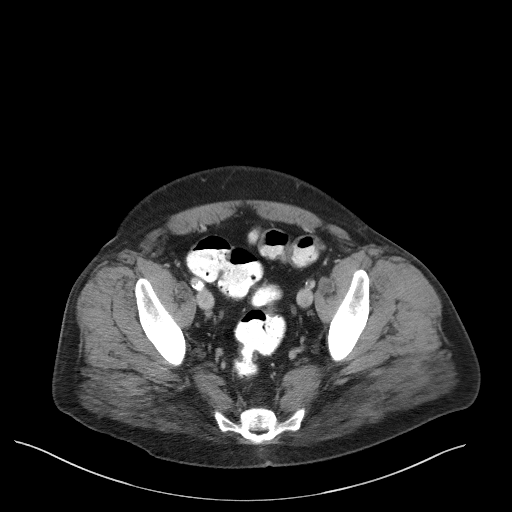
[im 34/95  soft-tissue]
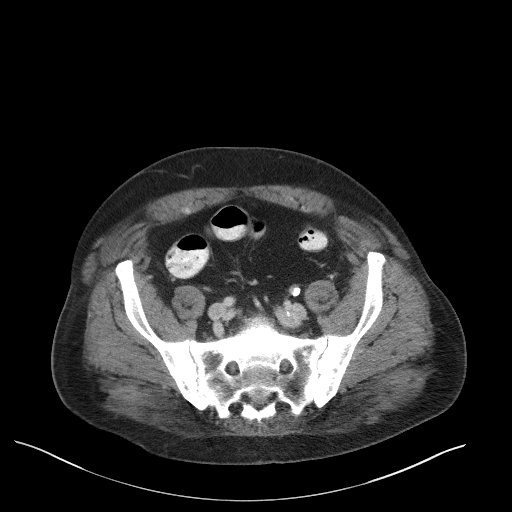
[im 39/95  soft-tissue]
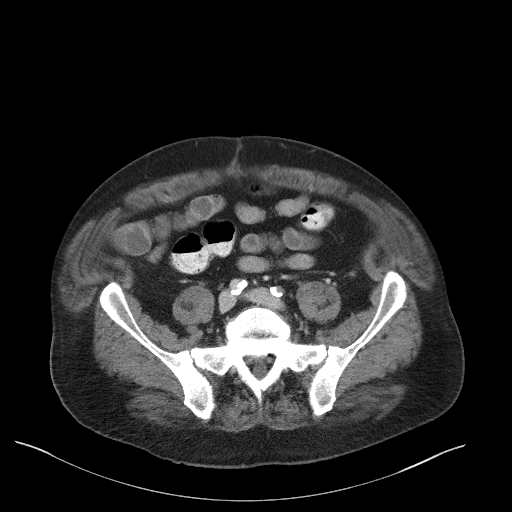
[im 50/95  soft-tissue]
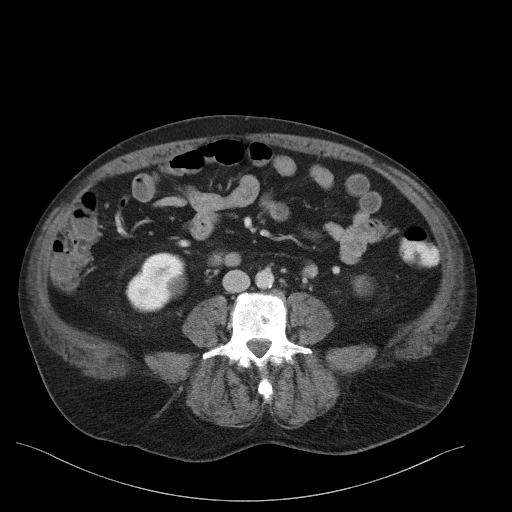
[im 56/95  soft-tissue]
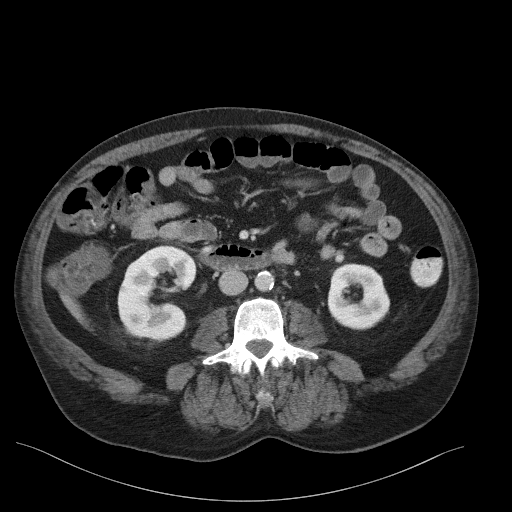
[im 61/95  soft-tissue]
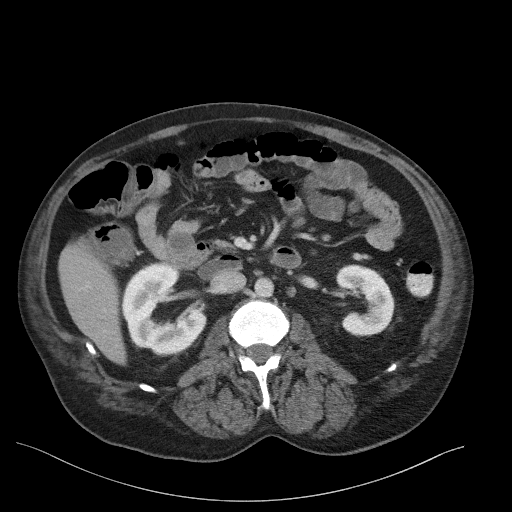
[im 61/95  bone]
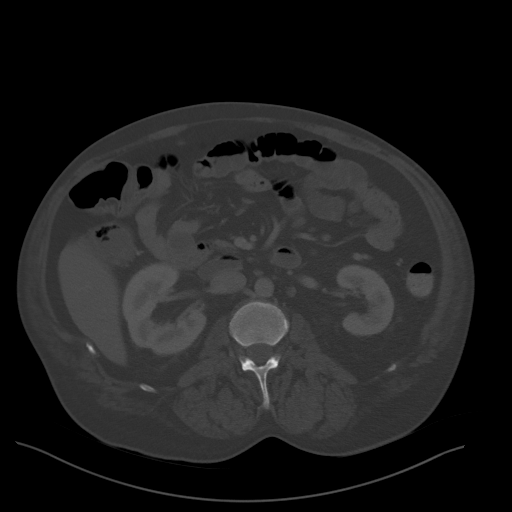
[im 67/95  soft-tissue]
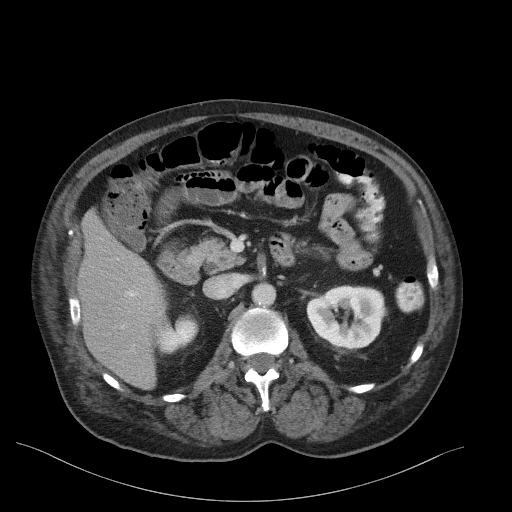
[im 72/95  soft-tissue]
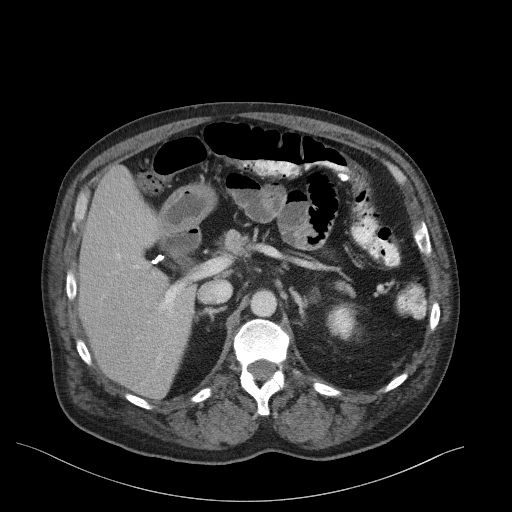
[im 83/95  soft-tissue]
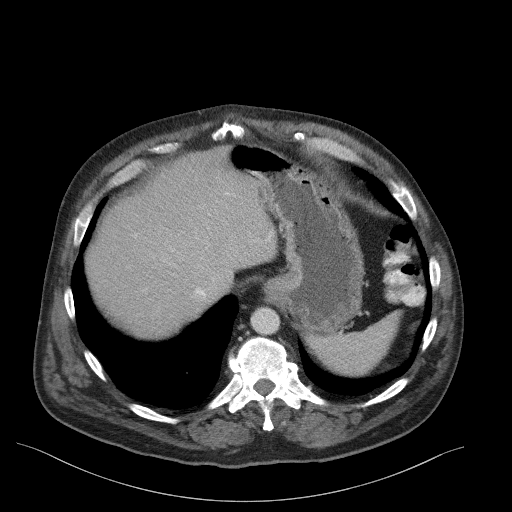
[im 89/95  soft-tissue]
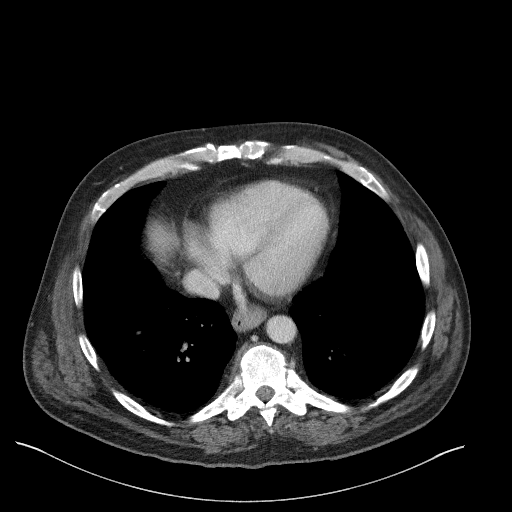

[Series 4: coronal st · coronal · 0.78mm/px · 3 of 103 slices shown]
[im 35/103  soft-tissue]
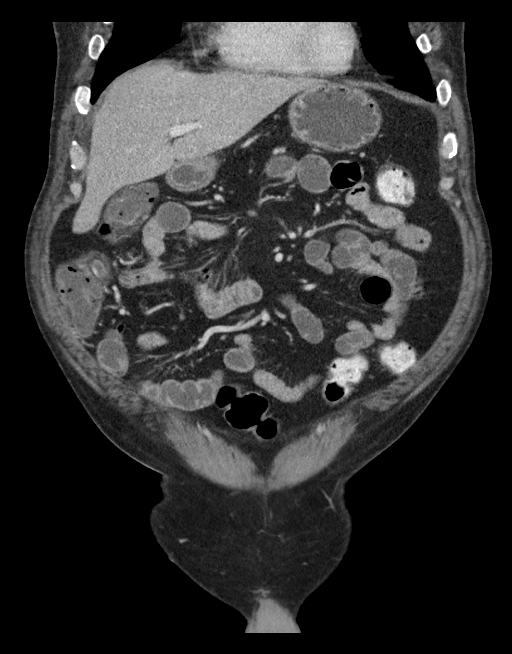
[im 46/103  soft-tissue]
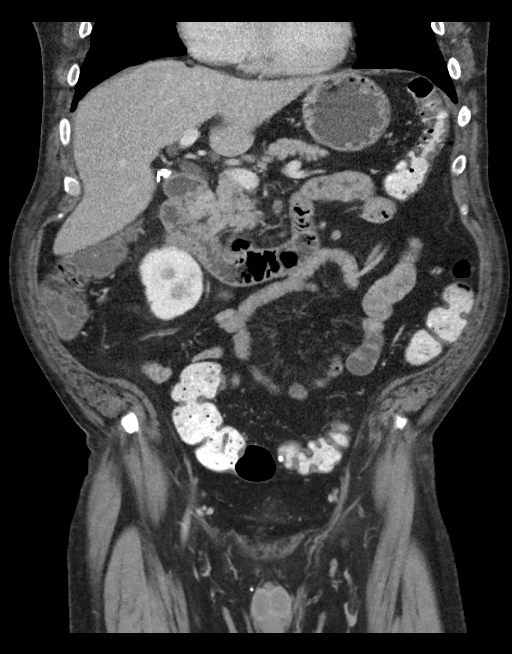
[im 57/103  soft-tissue]
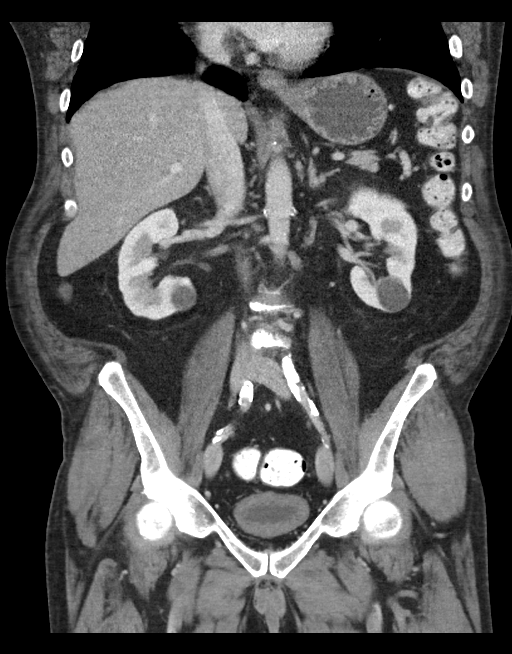

[16 of 46 positions shown; findings below may reference images not displayed]

FINDINGS: Lower chest: Lung bases clear.  Heart size within normal limits.

Hepatobiliary: Mild nodularity to the liver margin suggesting
cirrhosis. No liver mass. Cholecystectomy. No biliary dilatation.

Pancreas: Negative

Spleen: Negative

Adrenals/Urinary Tract: Negative for renal obstruction. 2 mm
nonobstructing stone right upper pole. Bilateral renal cysts. No
mass lesion. Urinary bladder is markedly thickened.

Stomach/Bowel: Air-fluid levels in nondilated small bowel. No bowel
edema. Colon appears normal. Normal appendix.

Vascular/Lymphatic: Atherosclerotic disease. Negative for aneurysm.
Negative for adenopathy

Reproductive: Mild prostate enlargement.

Other: No free fluid.  Negative for hernia.

Musculoskeletal: Mild lumbar degenerative change. No acute skeletal
abnormality.
IMPRESSION: Nodularity of the liver suggesting cirrhosis.  No liver mass

Air-fluid levels in nondilated small bowel probable ileus or
gastroenteritis. No bowel edema

Marked thickening of the bladder which could be acute or chronic.
Recommend correlation with urinalysis.

## 2018-06-01 IMAGING — DX DG CHEST 1V PORT
1 series · 1 of 1 positions shown · non-contrast
Comparison: 08/16/2014

CLINICAL DATA: Intubation.

EXAM:
PORTABLE CHEST 1 VIEW

[chest ap]
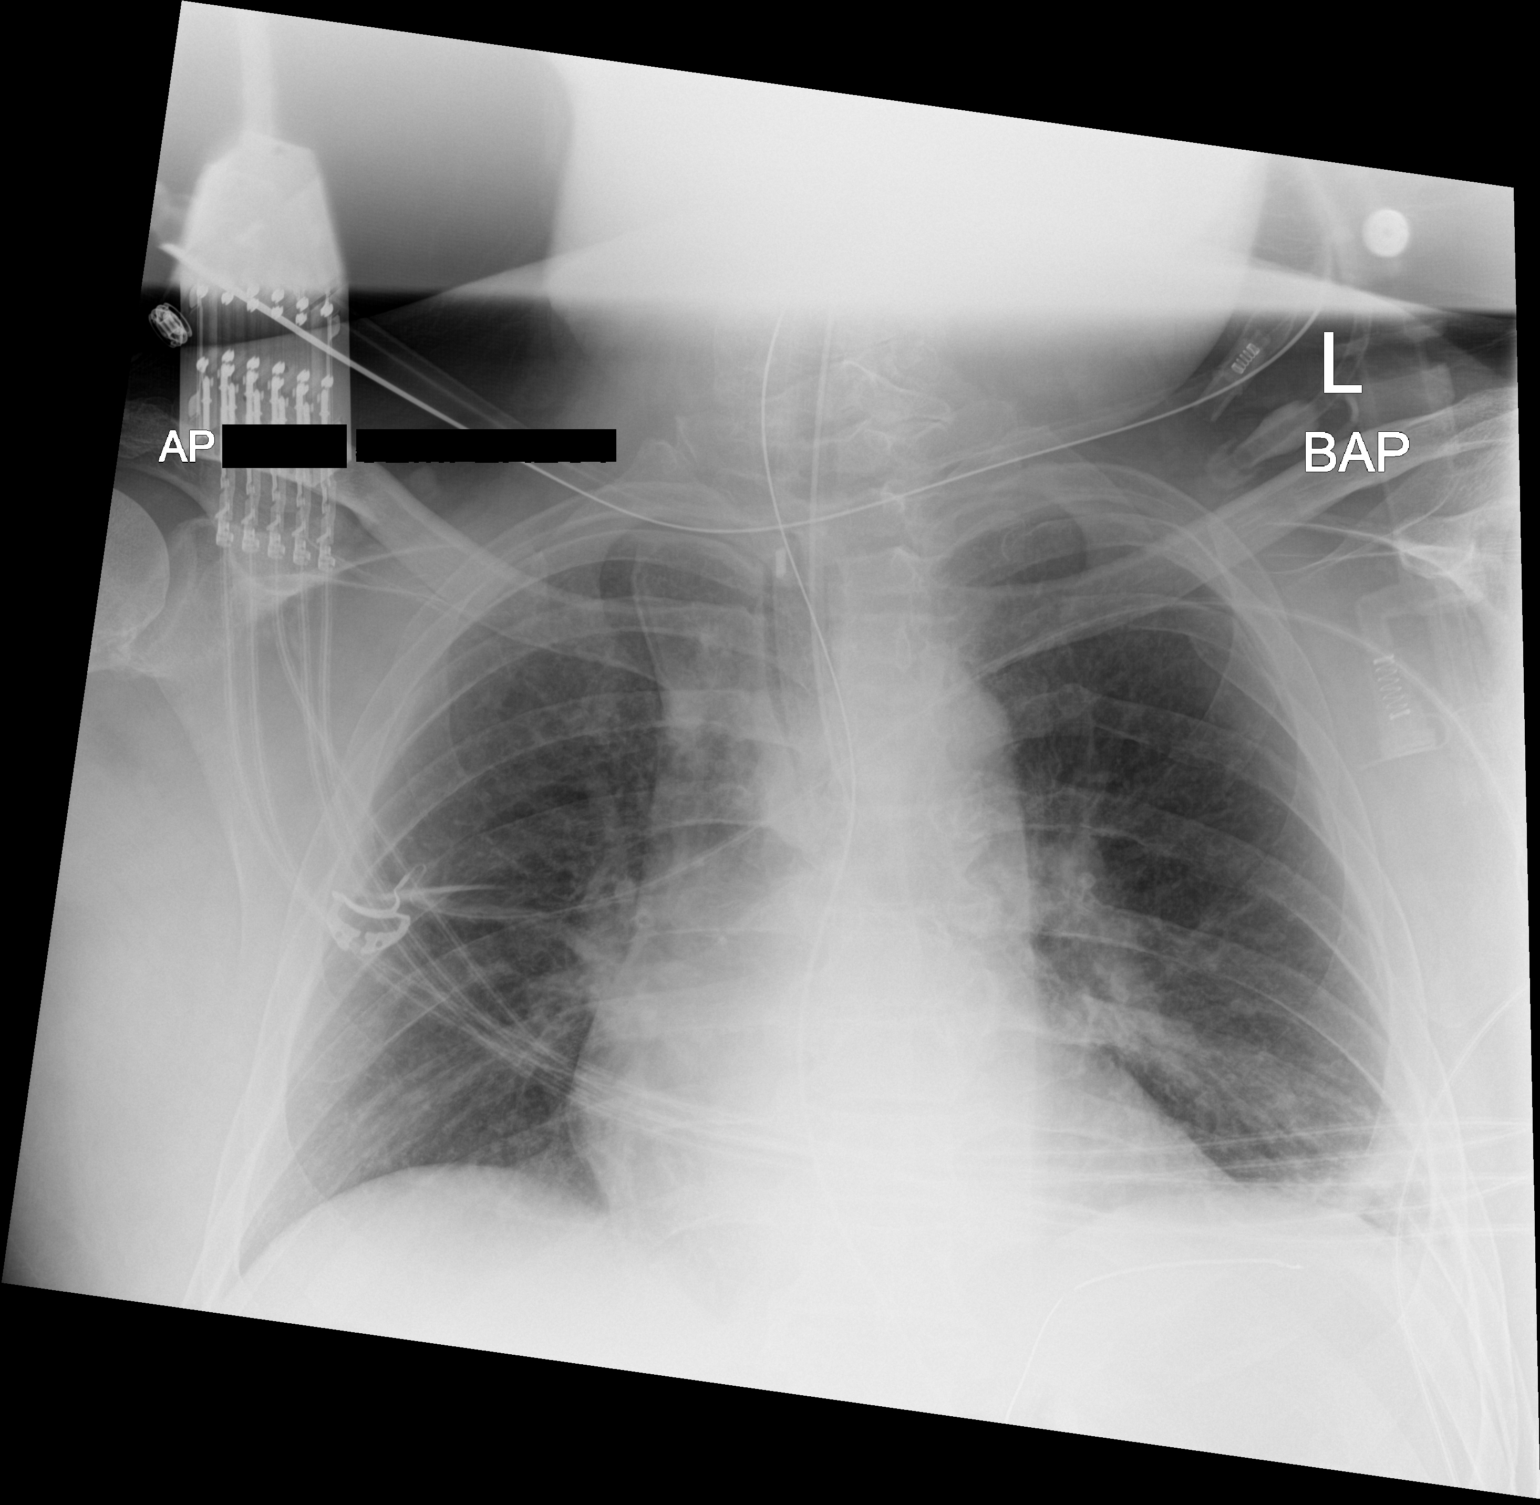

[1 of 1 positions shown; findings below may reference images not displayed]

FINDINGS: Endotracheal tube tip 1 cm above the carina. Nasogastric tube enters
the stomach. There is patchy infiltrate/volume loss in the lower
lobes, left more than right. Old healed rib fractures on the left.
IMPRESSION: Endotracheal tube 1 cm above the carina. Patchy infiltrate/
atelectasis at the lung bases left more than right.
